# Patient Record
Sex: Female | Born: 1975 | Race: White | Hispanic: No | Smoking: Current every day smoker
Health system: Southern US, Community
[De-identification: ages and names within clinical notes are randomized; demographics above are authoritative.]

## PROBLEM LIST (undated history)

## (undated) DIAGNOSIS — Z8619 Personal history of other infectious and parasitic diseases: Secondary | ICD-10-CM

## (undated) DIAGNOSIS — K449 Diaphragmatic hernia without obstruction or gangrene: Secondary | ICD-10-CM

## (undated) DIAGNOSIS — E039 Hypothyroidism, unspecified: Secondary | ICD-10-CM

## (undated) DIAGNOSIS — F4310 Post-traumatic stress disorder, unspecified: Secondary | ICD-10-CM

## (undated) DIAGNOSIS — Z87442 Personal history of urinary calculi: Secondary | ICD-10-CM

## (undated) DIAGNOSIS — J309 Allergic rhinitis, unspecified: Secondary | ICD-10-CM

## (undated) DIAGNOSIS — I10 Essential (primary) hypertension: Secondary | ICD-10-CM

## (undated) DIAGNOSIS — F419 Anxiety disorder, unspecified: Secondary | ICD-10-CM

## (undated) DIAGNOSIS — K219 Gastro-esophageal reflux disease without esophagitis: Secondary | ICD-10-CM

## (undated) DIAGNOSIS — N200 Calculus of kidney: Secondary | ICD-10-CM

## (undated) DIAGNOSIS — J342 Deviated nasal septum: Secondary | ICD-10-CM

## (undated) DIAGNOSIS — F329 Major depressive disorder, single episode, unspecified: Secondary | ICD-10-CM

## (undated) DIAGNOSIS — G5710 Meralgia paresthetica, unspecified lower limb: Secondary | ICD-10-CM

## (undated) DIAGNOSIS — E785 Hyperlipidemia, unspecified: Secondary | ICD-10-CM

## (undated) DIAGNOSIS — Z8489 Family history of other specified conditions: Secondary | ICD-10-CM

## (undated) HISTORY — DX: Post-traumatic stress disorder, unspecified: F43.10

## (undated) HISTORY — DX: Family history of other specified conditions: Z84.89

## (undated) HISTORY — DX: Anxiety disorder, unspecified: F41.9

## (undated) HISTORY — PX: BLADDER SURGERY: SHX569

## (undated) HISTORY — DX: Major depressive disorder, single episode, unspecified: F32.9

## (undated) HISTORY — PX: WISDOM TOOTH EXTRACTION: SHX21

## (undated) HISTORY — DX: Calculus of kidney: N20.0

## (undated) HISTORY — DX: Personal history of other infectious and parasitic diseases: Z86.19

## (undated) HISTORY — DX: Meralgia paresthetica, unspecified lower limb: G57.10

## (undated) HISTORY — DX: Personal history of urinary calculi: Z87.442

## (undated) HISTORY — DX: Allergic rhinitis, unspecified: J30.9

## (undated) HISTORY — DX: Hyperlipidemia, unspecified: E78.5

## (undated) HISTORY — DX: Diaphragmatic hernia without obstruction or gangrene: K44.9

## (undated) HISTORY — DX: Deviated nasal septum: J34.2

## (undated) HISTORY — DX: Essential (primary) hypertension: I10

---

## 2004-07-17 ENCOUNTER — Ambulatory Visit: Payer: Self-pay | Admitting: Family Medicine

## 2004-09-18 ENCOUNTER — Ambulatory Visit (HOSPITAL_COMMUNITY): Admission: RE | Admit: 2004-09-18 | Discharge: 2004-09-18 | Payer: Self-pay | Admitting: *Deleted

## 2004-09-18 ENCOUNTER — Ambulatory Visit: Payer: Self-pay | Admitting: *Deleted

## 2004-09-24 ENCOUNTER — Ambulatory Visit (HOSPITAL_COMMUNITY): Admission: RE | Admit: 2004-09-24 | Discharge: 2004-09-24 | Payer: Self-pay | Admitting: *Deleted

## 2004-09-24 ENCOUNTER — Ambulatory Visit: Payer: Self-pay | Admitting: Cardiology

## 2004-09-27 ENCOUNTER — Ambulatory Visit: Payer: Self-pay | Admitting: *Deleted

## 2008-11-28 ENCOUNTER — Emergency Department (HOSPITAL_COMMUNITY): Admission: EM | Admit: 2008-11-28 | Discharge: 2008-11-28 | Payer: Self-pay | Admitting: Emergency Medicine

## 2008-11-29 ENCOUNTER — Ambulatory Visit (HOSPITAL_COMMUNITY): Admission: RE | Admit: 2008-11-29 | Discharge: 2008-11-29 | Payer: Self-pay | Admitting: Emergency Medicine

## 2008-11-30 ENCOUNTER — Emergency Department (HOSPITAL_COMMUNITY): Admission: EM | Admit: 2008-11-30 | Discharge: 2008-12-01 | Payer: Self-pay | Admitting: Emergency Medicine

## 2008-12-13 ENCOUNTER — Encounter (INDEPENDENT_AMBULATORY_CARE_PROVIDER_SITE_OTHER): Payer: Self-pay | Admitting: *Deleted

## 2008-12-26 ENCOUNTER — Emergency Department (HOSPITAL_COMMUNITY): Admission: EM | Admit: 2008-12-26 | Discharge: 2008-12-26 | Payer: Self-pay | Admitting: Emergency Medicine

## 2010-09-16 LAB — PREGNANCY, URINE: Preg Test, Ur: NEGATIVE

## 2010-09-17 LAB — COMPREHENSIVE METABOLIC PANEL
Albumin: 4.2 g/dL (ref 3.5–5.2)
Alkaline Phosphatase: 61 U/L (ref 39–117)
BUN: 12 mg/dL (ref 6–23)
CO2: 28 mEq/L (ref 19–32)
Calcium: 9.6 mg/dL (ref 8.4–10.5)
Chloride: 106 mEq/L (ref 96–112)
Creatinine, Ser: 0.81 mg/dL (ref 0.4–1.2)
Glucose, Bld: 105 mg/dL — ABNORMAL HIGH (ref 70–99)
Sodium: 137 mEq/L (ref 135–145)
Total Bilirubin: 0.5 mg/dL (ref 0.3–1.2)
Total Protein: 7.4 g/dL (ref 6.0–8.3)

## 2010-09-17 LAB — BASIC METABOLIC PANEL
CO2: 24 mEq/L (ref 19–32)
Calcium: 9 mg/dL (ref 8.4–10.5)
Chloride: 109 mEq/L (ref 96–112)
Creatinine, Ser: 0.88 mg/dL (ref 0.4–1.2)
GFR calc non Af Amer: >60 mL/min (ref >60)
Potassium: 3.3 mEq/L — ABNORMAL LOW (ref 3.5–5.1)

## 2010-09-17 LAB — DIFFERENTIAL
Basophils Absolute: 0 10*3/uL (ref 0.0–0.1)
Eosinophils Absolute: 0.1 10*3/uL (ref 0.0–0.7)
Eosinophils Relative: 1 % (ref 0–5)
Lymphocytes Relative: 19 % (ref 12–46)
Lymphs Abs: 1.9 10*3/uL (ref 0.7–4.0)
Lymphs Abs: 2.1 10*3/uL (ref 0.7–4.0)
Monocytes Absolute: 0.7 10*3/uL (ref 0.1–1.0)
Monocytes Relative: 10 % (ref 3–12)
Monocytes Relative: 7 % (ref 3–12)
Neutro Abs: 7 10*3/uL (ref 1.7–7.7)
Neutrophils Relative %: 62 % (ref 43–77)

## 2010-09-17 LAB — CBC
HCT: 32.5 % — ABNORMAL LOW (ref 36.0–46.0)
HCT: 36.5 % (ref 36.0–46.0)
Hemoglobin: 11.4 g/dL — ABNORMAL LOW (ref 12.0–15.0)
Hemoglobin: 12.8 g/dL (ref 12.0–15.0)
MCV: 89.8 fL (ref 78.0–100.0)
Platelets: 230 10*3/uL (ref 150–400)
RBC: 3.69 MIL/uL — ABNORMAL LOW (ref 3.87–5.11)
RBC: 4.07 MIL/uL (ref 3.87–5.11)
RDW: 12.7 % (ref 11.5–15.5)

## 2010-09-17 LAB — URINALYSIS, ROUTINE W REFLEX MICROSCOPIC
Bilirubin Urine: NEGATIVE
Hgb urine dipstick: NEGATIVE
Ketones, ur: NEGATIVE mg/dL
Nitrite: NEGATIVE
Protein, ur: NEGATIVE mg/dL
Specific Gravity, Urine: >1.03 — ABNORMAL HIGH (ref 1.005–1.030)

## 2010-09-17 LAB — PREGNANCY, URINE: Preg Test, Ur: NEGATIVE

## 2010-10-26 NOTE — Procedures (Signed)
[name], Catherine [name]                ACCOUNT NO.:  [id number]   MEDICAL RECORD NO.:  [id number]          PATIENT TYPE:  OUT   LOCATION:  RAD                           FACILITY:  [facility]   PHYSICIAN:  [place] Bing, M.D.  DATE OF BIRTH:  03-24-1976   DATE OF PROCEDURE:  09/24/2004  DATE OF DISCHARGE:                                  ECHOCARDIOGRAM   REFERRING PHYSICIAN:  Vida Roller, M.D.   CLINICAL DATA:  A 35 year old woman with chest pain.   M-MODE:  Aorta 2.8, left atrium 4.4, septum 1, posterior wall 1, LV diastole  4.6, LV systole 3.2.   IMPRESSION:  1.  Technically adequate echocardiographic study.  2.  Left atrial size at the upper limit of normal;  normal right atrium and      right ventricle.  3.  Normal aortic, tricuspid, mitral, and pulmonic valves;  physiologic      tricuspid regurgitation.  4.  Normal internal dimension, wall thickness, regional and global function      of the left ventricle.  5.  Normal inferior vena cava.      RR/MEDQ  D:  09/24/2004  T:  09/24/2004  Job:  [id number]

## 2012-02-26 ENCOUNTER — Other Ambulatory Visit: Payer: Self-pay | Admitting: Obstetrics & Gynecology

## 2012-02-26 ENCOUNTER — Other Ambulatory Visit (HOSPITAL_COMMUNITY)
Admission: RE | Admit: 2012-02-26 | Discharge: 2012-02-26 | Disposition: A | Discharge status: Home / Self Care | Payer: BC Managed Care – PPO | Source: Ambulatory Visit | Attending: Obstetrics & Gynecology | Admitting: Obstetrics & Gynecology

## 2012-02-26 DIAGNOSIS — Z01419 Encounter for gynecological examination (general) (routine) without abnormal findings: Secondary | ICD-10-CM | POA: Insufficient documentation

## 2012-03-12 ENCOUNTER — Other Ambulatory Visit: Payer: Self-pay

## 2012-03-12 ENCOUNTER — Ambulatory Visit (HOSPITAL_COMMUNITY): Payer: Self-pay

## 2012-03-12 ENCOUNTER — Other Ambulatory Visit (HOSPITAL_COMMUNITY): Payer: Self-pay

## 2012-03-12 ENCOUNTER — Other Ambulatory Visit (HOSPITAL_COMMUNITY): Payer: Self-pay | Admitting: Nurse Practitioner

## 2012-03-12 ENCOUNTER — Other Ambulatory Visit (HOSPITAL_COMMUNITY): Payer: Self-pay | Admitting: *Deleted

## 2012-03-12 ENCOUNTER — Ambulatory Visit (HOSPITAL_COMMUNITY)
Admission: RE | Admit: 2012-03-12 | Discharge: 2012-03-12 | Disposition: A | Discharge status: Home / Self Care | Payer: BC Managed Care – PPO | Source: Ambulatory Visit | Attending: Nurse Practitioner | Admitting: Nurse Practitioner

## 2012-03-12 DIAGNOSIS — R52 Pain, unspecified: Secondary | ICD-10-CM

## 2012-03-12 DIAGNOSIS — N949 Unspecified condition associated with female genital organs and menstrual cycle: Secondary | ICD-10-CM | POA: Insufficient documentation

## 2012-03-12 DIAGNOSIS — N920 Excessive and frequent menstruation with regular cycle: Secondary | ICD-10-CM | POA: Insufficient documentation

## 2012-04-03 ENCOUNTER — Telehealth: Payer: Self-pay

## 2012-05-14 NOTE — Telephone Encounter (Signed)
No message

## 2013-02-19 ENCOUNTER — Emergency Department (HOSPITAL_COMMUNITY): Payer: BC Managed Care – PPO

## 2013-02-19 ENCOUNTER — Emergency Department (HOSPITAL_COMMUNITY)
Admission: EM | Admit: 2013-02-19 | Discharge: 2013-02-19 | Disposition: A | Discharge status: Home / Self Care | Payer: Self-pay | Attending: Emergency Medicine | Admitting: Emergency Medicine

## 2013-02-19 ENCOUNTER — Encounter (HOSPITAL_COMMUNITY): Payer: Self-pay | Admitting: Emergency Medicine

## 2013-02-19 DIAGNOSIS — Z79899 Other long term (current) drug therapy: Secondary | ICD-10-CM | POA: Insufficient documentation

## 2013-02-19 DIAGNOSIS — M545 Low back pain, unspecified: Secondary | ICD-10-CM | POA: Insufficient documentation

## 2013-02-19 DIAGNOSIS — M5441 Lumbago with sciatica, right side: Secondary | ICD-10-CM

## 2013-02-19 DIAGNOSIS — E039 Hypothyroidism, unspecified: Secondary | ICD-10-CM | POA: Insufficient documentation

## 2013-02-19 DIAGNOSIS — M5430 Sciatica, unspecified side: Secondary | ICD-10-CM | POA: Insufficient documentation

## 2013-02-19 HISTORY — DX: Hypothyroidism, unspecified: E03.9

## 2013-02-19 MED ORDER — DIAZEPAM 5 MG/ML IJ SOLN
5.0000 mg | Freq: Once | INTRAMUSCULAR | Status: AC
  Administered 2013-02-19: 5 mg via INTRAMUSCULAR
  Filled 2013-02-19: qty 2

## 2013-02-19 MED ORDER — METHOCARBAMOL 500 MG PO TABS: 500.0000 mg | ORAL_TABLET | Freq: Two times a day (BID) | ORAL | Status: DC

## 2013-02-19 MED ORDER — HYDROCODONE-ACETAMINOPHEN 5-325 MG PO TABS: 1.0000 | ORAL_TABLET | Freq: Four times a day (QID) | ORAL | Status: DC | PRN

## 2013-02-19 MED ORDER — IBUPROFEN 800 MG PO TABS: 800.0000 mg | ORAL_TABLET | Freq: Three times a day (TID) | ORAL | Status: DC

## 2013-02-19 MED ORDER — OXYCODONE-ACETAMINOPHEN 5-325 MG PO TABS
2.0000 | ORAL_TABLET | Freq: Once | ORAL | Status: AC
  Administered 2013-02-19: 2 via ORAL
  Filled 2013-02-19: qty 2

## 2013-02-19 NOTE — ED Notes (Signed)
Patient states that she has had to stand since she came here, because "it hurts too bad to sit".

## 2013-02-19 NOTE — ED Provider Notes (Signed)
CSN: [id number]     Arrival date & time 02/19/13  1342 History  This chart was scribed for non-physician practitioner, Felicie Morn, NP, working with Dagmar Hait, MD by Shari Heritage, ED Scribe. This patient was seen in room TR07C/TR07C and the patient's care was started at 3:16 PM.    Chief Complaint  Patient presents with  . Back Pain    Patient is a 37 y.o. female presenting with back pain. The history is provided by the patient. No language interpreter was used.  Back Pain Location:  Lumbar spine Radiates to: right hip. Pain severity:  Moderate Duration: onset this morning. Timing:  Constant Progression:  Unchanged Chronicity:  New Context comment:  Strain Ineffective treatments: Toradol. Associated symptoms: no bladder incontinence, no bowel incontinence and no weakness     HPI Comments: Catherine [name] is a 37 y.o. female who presents to the Emergency Department complaining of constant, moderate lower back pain that radiates to her right hip onset this morning. She states that she strained her back while trying to get out of bed. She was seen for this problem in a clinic and was given a shot of Toradol, but it has not improved pain. She denies extremity weakness, bowel incontinence or bladder incontinence. She denies a prior history of back pain. She has a medical history of hypothyroidism. She takes levothyroxine daily. She does not smoke.  Past Medical History  Diagnosis Date  . Hypothyroidism    Past Surgical History  Procedure Laterality Date  . Bladder surgery      reports had bladder stretched as child  . Wisdom tooth extraction     No family history on file. History  Substance Use Topics  . Smoking status: Never Smoker   . Smokeless tobacco: Not on file  . Alcohol Use: No   OB History   Grav Para Term Preterm Abortions TAB SAB Ect Mult Living                 Review of Systems  Gastrointestinal: Negative for bowel incontinence.  Genitourinary:  Negative for bladder incontinence.  Musculoskeletal: Positive for back pain.  Neurological: Negative for weakness.  All other systems reviewed and are negative.    Allergies  Morphine and related and Sulfa antibiotics  Home Medications   Current Outpatient Rx  Name  Route  Sig  Dispense  Refill  . docusate sodium (COLACE) 100 MG capsule   Oral   Take 100 mg by mouth as needed for constipation.         . Fiber CHEW   Oral   Chew 1 tablet by mouth daily.         . fluticasone (FLONASE) 50 MCG/ACT nasal spray   Nasal   Place 1 spray into the nose as needed for rhinitis or allergies.         [name] ibuprofen (ADVIL,MOTRIN) 200 MG tablet   Oral   Take 800 mg by mouth every 6 (six) hours as needed for pain.         [name] levothyroxine (SYNTHROID, LEVOTHROID) 100 MCG tablet   Oral   Take 100 mcg by mouth daily before breakfast.         . omeprazole (PRILOSEC) 20 MG capsule   Oral   Take 20 mg by mouth as needed (acid reflux).          Triage Vitals: BP 168/93  Pulse 89  Temp(Src) 98.3 F (36.8 C) (Oral)  Resp 20  SpO2 96%  LMP 02/07/2013 Physical Exam  Nursing note and vitals reviewed. Constitutional: She is oriented to person, place, and time. She appears well-developed and well-nourished. No distress.  HENT:  Head: Normocephalic and atraumatic.  Eyes: EOM are normal.  Neck: Neck supple. No tracheal deviation present.  Cardiovascular: Normal rate and regular rhythm.   Pulmonary/Chest: Effort normal and breath sounds normal. No respiratory distress.  Musculoskeletal: Normal range of motion.  Low back pain radiating to right hip, spasmodic and sharp in nature.  Neurological: She is alert and oriented to person, place, and time.  Skin: Skin is warm and dry.  Psychiatric: She has a normal mood and affect. Her behavior is normal.    ED Course  Procedures (including critical care time) DIAGNOSTIC STUDIES: Oxygen Saturation is 96% on room air, adequate by my  interpretation.    COORDINATION OF CARE: 3:24 PM- I suspect muscle spasm of the lumbar muscles, but will order x-ray to rule out bony fracture. Will order a muscle relaxant. Patient informed of current plan for treatment and evaluation and agrees with plan at this time.    Imaging Review Dg Lumbar Spine Complete  02/19/2013   CLINICAL DATA:  Back injury, lower back pain extending right sacral region  EXAM: LUMBAR SPINE - COMPLETE 4+ VIEW  COMPARISON:  None.  FINDINGS: There is no evidence of lumbar spine fracture. Alignment is normal. Intervertebral disc spaces are maintained. Minimal anterior spurring upper endplate of L4 vertebral body.  IMPRESSION: No acute fracture or subluxation. Minimal anterior spurring upper endplate of L4 vertebral body.   Electronically Signed   By: Natasha Mead   On: 02/19/2013 16:06   Radiology results reviewed and discussed with patient.  Patient feels better after medication. Patient ambulatory in the department without difficulty.   MDM   Low back pain.   I personally performed the services described in this documentation, which was scribed in my presence. The recorded information has been reviewed and is accurate.    Jimmye Norman, NP 02/19/13 1640

## 2013-02-19 NOTE — ED Notes (Signed)
Pt reports lower back pain that's radiation to R hip ; pt reports hurt back when got out of bed this AM, no hx of back pain prior; pt had toradol shot at clinic and reports it did not help; denies loss in bowel function, no radiation to legs

## 2013-02-20 NOTE — ED Provider Notes (Signed)
Medical screening examination/treatment/procedure(s) were performed by non-physician practitioner and as supervising physician I was immediately available for consultation/collaboration.   Dagmar Hait, MD 02/20/13 [phone]

## 2013-04-15 ENCOUNTER — Other Ambulatory Visit: Payer: Self-pay

## 2013-06-08 ENCOUNTER — Encounter: Payer: Self-pay | Admitting: Advanced Practice Midwife

## 2013-06-08 ENCOUNTER — Ambulatory Visit (INDEPENDENT_AMBULATORY_CARE_PROVIDER_SITE_OTHER): Payer: Managed Care, Other (non HMO) | Admitting: Advanced Practice Midwife

## 2013-06-08 ENCOUNTER — Other Ambulatory Visit: Payer: Self-pay | Admitting: Advanced Practice Midwife

## 2013-06-08 VITALS — BP 140/90 | Ht 69.0 in | Wt 243.0 lb

## 2013-06-08 DIAGNOSIS — R928 Other abnormal and inconclusive findings on diagnostic imaging of breast: Secondary | ICD-10-CM

## 2013-06-08 DIAGNOSIS — N63 Unspecified lump in unspecified breast: Secondary | ICD-10-CM

## 2013-06-08 NOTE — Progress Notes (Signed)
Catherine [name] 37 y.o. Filed Vitals:   06/08/13 1514  BP: 140/90   Past Medical History  Diagnosis Date  . Hypothyroidism    Past Surgical History  Procedure Laterality Date  . Bladder surgery      reports had bladder stretched as child  . Wisdom tooth extraction     Catherine [name] does not currently have medications on file.  About 3 weeks ago noticed a nontender lump in left breast during self exam.  Physical Exam:  1.5 cm firm, nontender, mobile nodule at 11 o'clock in L breast.  No retractions or discharge.  Right breast unremarkable.  Mammogram 06/15/13 at [facility] in [place].

## 2013-06-14 ENCOUNTER — Other Ambulatory Visit: Payer: Self-pay | Admitting: Obstetrics and Gynecology

## 2013-06-14 ENCOUNTER — Other Ambulatory Visit: Payer: Self-pay

## 2013-06-14 DIAGNOSIS — N63 Unspecified lump in unspecified breast: Secondary | ICD-10-CM

## 2013-06-15 ENCOUNTER — Ambulatory Visit
Admission: RE | Admit: 2013-06-15 | Discharge: 2013-06-15 | Disposition: A | Discharge status: Home / Self Care | Payer: Private Health Insurance - Indemnity | Source: Ambulatory Visit | Attending: Advanced Practice Midwife | Admitting: Advanced Practice Midwife

## 2013-06-15 DIAGNOSIS — N63 Unspecified lump in unspecified breast: Secondary | ICD-10-CM

## 2013-07-27 ENCOUNTER — Other Ambulatory Visit: Payer: Managed Care, Other (non HMO) | Admitting: Obstetrics & Gynecology

## 2013-08-04 ENCOUNTER — Other Ambulatory Visit: Payer: Self-pay | Admitting: Internal Medicine

## 2013-08-05 ENCOUNTER — Other Ambulatory Visit: Payer: Managed Care, Other (non HMO) | Admitting: Obstetrics & Gynecology

## 2013-08-13 ENCOUNTER — Other Ambulatory Visit: Payer: Managed Care, Other (non HMO) | Admitting: Obstetrics & Gynecology

## 2013-08-23 ENCOUNTER — Ambulatory Visit (INDEPENDENT_AMBULATORY_CARE_PROVIDER_SITE_OTHER): Payer: Managed Care, Other (non HMO) | Admitting: Obstetrics & Gynecology

## 2013-08-23 ENCOUNTER — Other Ambulatory Visit (HOSPITAL_COMMUNITY)
Admission: RE | Admit: 2013-08-23 | Discharge: 2013-08-23 | Disposition: A | Discharge status: Home / Self Care | Payer: Private Health Insurance - Indemnity | Source: Ambulatory Visit | Attending: Obstetrics & Gynecology | Admitting: Obstetrics & Gynecology

## 2013-08-23 ENCOUNTER — Encounter: Payer: Self-pay | Admitting: Obstetrics & Gynecology

## 2013-08-23 VITALS — BP 120/80 | Ht 69.0 in | Wt 239.0 lb

## 2013-08-23 DIAGNOSIS — Z01419 Encounter for gynecological examination (general) (routine) without abnormal findings: Secondary | ICD-10-CM

## 2013-08-23 DIAGNOSIS — Z1151 Encounter for screening for human papillomavirus (HPV): Secondary | ICD-10-CM | POA: Insufficient documentation

## 2013-08-23 MED ORDER — MEGESTROL ACETATE 40 MG PO TABS: 40.0000 mg | ORAL_TABLET | Freq: Every day | ORAL | Status: DC

## 2013-08-23 NOTE — Progress Notes (Signed)
Patient ID: Catherine [name], female   DOB: 1975-10-16, 38 y.o.   MRN: [id number] Subjective:     Catherine [name] is a 38 y.o. female here for a routine exam.  Patient's last menstrual period was 08/03/2013. No obstetric history on file. Birth Control Method:  No BCM Menstrual Calendar(currently): twice a month  Current complaints: heavy painful periods, pain with intercourse 75%, interrupts every time.   Current acute medical issues:  hypothyroid   Recent Gynecologic History Patient's last menstrual period was 08/03/2013. Last Pap: 2013,  normal Last mammogram: 2015,  normal  Past Medical History  Diagnosis Date  . Hypothyroidism     Past Surgical History  Procedure Laterality Date  . Bladder surgery      reports had bladder stretched as child  . Wisdom tooth extraction      OB History   Grav Para Term Preterm Abortions TAB SAB Ect Mult Living                  History   Social History  . Marital Status: Married    Spouse Name: N/A    Number of Children: N/A  . Years of Education: N/A   Social History Main Topics  . Smoking status: Former Smoker -- 0.50 packs/day for 19 years    Types: Cigarettes    Quit date: 10/08/2011  . Smokeless tobacco: Never Used  . Alcohol Use: 0.0 oz/week     Comment: very rarley  . Drug Use: No  . Sexual Activity: Yes   Other Topics Concern  . None   Social History Narrative  . None    Family History  Problem Relation Age of Onset  . Heart disease Father   . Hypertension Father   . Cancer Maternal Aunt   . Thyroid disease Maternal Aunt   . Cancer Paternal Aunt   . Thyroid disease Maternal Grandmother   . Cancer Maternal Grandmother   . Diabetes Maternal Grandfather   . Hypertension Maternal Grandfather      Review of Systems  Review of Systems  Constitutional: Negative for fever, chills, weight loss, malaise/fatigue and diaphoresis.  HENT: Negative for hearing loss, ear pain, nosebleeds, congestion, sore throat, neck  pain, tinnitus and ear discharge.   Eyes: Negative for blurred vision, double vision, photophobia, pain, discharge and redness.  Respiratory: Negative for cough, hemoptysis, sputum production, shortness of breath, wheezing and stridor.   Cardiovascular: Negative for chest pain, palpitations, orthopnea, claudication, leg swelling and PND.  Gastrointestinal: negative for abdominal pain. Negative for heartburn, nausea, vomiting, diarrhea, constipation, blood in stool and melena.  Genitourinary: Negative for dysuria, urgency, frequency, hematuria and flank pain.  Musculoskeletal: Negative for myalgias, back pain, joint pain and falls.  Skin: Negative for itching and rash.  Neurological: Negative for dizziness, tingling, tremors, sensory change, speech change, focal weakness, seizures, loss of consciousness, weakness and headaches.  Endo/Heme/Allergies: Negative for environmental allergies and polydipsia. Does not bruise/bleed easily.  Psychiatric/Behavioral: Negative for depression, suicidal ideas, hallucinations, memory loss and substance abuse. The patient is not nervous/anxious and does not have insomnia.        Objective:    Physical Exam  Vitals reviewed. Constitutional: She is oriented to person, place, and time. She appears well-developed and well-nourished.  HENT:  Head: Normocephalic and atraumatic.        Right Ear: External ear normal.  Left Ear: External ear normal.  Nose: Nose normal.  Mouth/Throat: Oropharynx is clear and moist.  Eyes: Conjunctivae and  EOM are normal. Pupils are equal, round, and reactive to light. Right eye exhibits no discharge. Left eye exhibits no discharge. No scleral icterus.  Neck: Normal range of motion. Neck supple. No tracheal deviation present. No thyromegaly present.  Cardiovascular: Normal rate, regular rhythm, normal heart sounds and intact distal pulses.  Exam reveals no gallop and no friction rub.   No murmur heard. Respiratory: Effort normal  and breath sounds normal. No respiratory distress. She has no wheezes. She has no rales. She exhibits no tenderness.  GI: Soft. Bowel sounds are normal. She exhibits no distension and no mass. There is no tenderness. There is no rebound and no guarding.  Genitourinary:  Breasts no masses skin changes or nipple changes bilaterally      Vulva is normal without lesions Vagina is pink moist without discharge Cervix normal in appearance and pap is done, very tender with slight Cervical motion Uterus is normal size shape and contour Adnexa is negative with normal sized ovaries   Musculoskeletal: Normal range of motion. She exhibits no edema and no tenderness.  Neurological: She is alert and oriented to person, place, and time. She has normal reflexes. She displays normal reflexes. No cranial nerve deficit. She exhibits normal muscle tone. Coordination normal.  Skin: Skin is warm and dry. No rash noted. No erythema. No pallor.  Psychiatric: She has a normal mood and affect. Her behavior is normal. Judgment and thought content normal.       Assessment:    Healthy female exam.   Dysmenorrhea Menorrhagia dyspreunia Plan:    Follow up in: 1 month.   Megestrol for 1 month Sonogram 1 month

## 2013-09-21 ENCOUNTER — Other Ambulatory Visit: Payer: Self-pay | Admitting: Obstetrics & Gynecology

## 2013-09-21 ENCOUNTER — Encounter: Payer: Self-pay | Admitting: Obstetrics & Gynecology

## 2013-09-21 ENCOUNTER — Ambulatory Visit (INDEPENDENT_AMBULATORY_CARE_PROVIDER_SITE_OTHER): Payer: Managed Care, Other (non HMO)

## 2013-09-21 ENCOUNTER — Ambulatory Visit (INDEPENDENT_AMBULATORY_CARE_PROVIDER_SITE_OTHER): Payer: Managed Care, Other (non HMO) | Admitting: Obstetrics & Gynecology

## 2013-09-21 VITALS — BP 130/90 | Wt 226.0 lb

## 2013-09-21 DIAGNOSIS — IMO0002 Reserved for concepts with insufficient information to code with codable children: Secondary | ICD-10-CM

## 2013-09-21 DIAGNOSIS — N946 Dysmenorrhea, unspecified: Secondary | ICD-10-CM

## 2013-09-21 DIAGNOSIS — N920 Excessive and frequent menstruation with regular cycle: Secondary | ICD-10-CM

## 2013-09-21 DIAGNOSIS — Z01419 Encounter for gynecological examination (general) (routine) without abnormal findings: Secondary | ICD-10-CM

## 2013-09-21 MED ORDER — HYDROCODONE-ACETAMINOPHEN 5-325 MG PO TABS: 1.0000 | ORAL_TABLET | Freq: Four times a day (QID) | ORAL | Status: DC | PRN

## 2013-09-21 NOTE — Progress Notes (Signed)
Patient ID: KOREE STAHELI, female   DOB: [birth year], 38 y.o.   MRN: [id number] US Transvaginal Non-ob  09/21/2013   GYNECOLOGIC SONOGRAM   MARIYANA FULOP is a 38 y.o. G1P0010 LMP 09/18/2013(spotting) for a pelvic  sonogram for dysmenorrhea, dyspareunia and menorrhagia. Pt currently  taking Megace.  Uterus                      7.7 x 4.6 x 4.7 cm, anteverted uterus no  myometrial masses noted   Endometrium          12.0 mm, asymmetrical, hyperechoic area noted within  cavity=7+mm  Right ovary             2.6 x 2.2 x 1.5 cm,   Left ovary                3.2 x 2.7 x 1.7 cm,   No free fluid or adnexal masses noted within pelvis  Technician Comments:  Anteverted uterus noted, ?endometrial polyp noted within cavity(7+mm),  bilateral adnexa/ovaries appear WNL, no free fluid or adnexal masses noted     Alicia Amel 09/21/2013 11:44 AM  Clinical Impression and recommendations:  I have reviewed the sonogram results above, combined with the patient's  current clinical course, below are my impressions and any appropriate  recommendations for management based on the sonographic findings.  Normal pelvic anatomy, no anatomical etiology for patient's symptoms Small endometrial polyp not clinically significant  Florian Buff 09/21/2013 11:58 AM      See previous note  75% dyspareunia Severe interrupts  Discussed options only viable 1 is hysterectomy with removal of cervix Would have to be abdominal  Past Medical History  Diagnosis Date  . Hypothyroidism     Past Surgical History  Procedure Laterality Date  . Bladder surgery      reports had bladder stretched as child  . Wisdom tooth extraction      OB History   Grav Para Term Preterm Abortions TAB SAB Ect Mult Living   1 0 0 0 1 0 1 0 0 0       Allergies  Allergen Reactions  . Morphine And Related Other (See Comments)    headache  . Sulfa Antibiotics Other (See Comments)    Gives pt Yeast infection    History   Social History  . Marital Status:  Married    Spouse Name: N/A    Number of Children: N/A  . Years of Education: N/A   Social History Main Topics  . Smoking status: Former Smoker -- 0.50 packs/day for 19 years    Types: Cigarettes    Quit date: 10/08/2011  . Smokeless tobacco: Never Used  . Alcohol Use: 0.0 oz/week     Comment: very rarley  . Drug Use: No  . Sexual Activity: Yes   Other Topics Concern  . None   Social History Narrative  . None    Family History  Problem Relation Age of Onset  . Heart disease Father   . Hypertension Father   . Cancer Maternal Aunt   . Thyroid disease Maternal Aunt   . Cancer Paternal Aunt   . Thyroid disease Maternal Grandmother   . Cancer Maternal Grandmother   . Diabetes Maternal Grandfather   . Hypertension Maternal Grandfather

## 2013-09-22 ENCOUNTER — Telehealth: Payer: Self-pay | Admitting: Obstetrics & Gynecology

## 2013-09-22 NOTE — Telephone Encounter (Signed)
Pt states that she wants to go ahead and get the surgery scheduled.

## 2013-09-23 ENCOUNTER — Ambulatory Visit: Payer: Managed Care, Other (non HMO) | Admitting: Obstetrics & Gynecology

## 2013-09-23 ENCOUNTER — Other Ambulatory Visit: Payer: Managed Care, Other (non HMO)

## 2013-09-24 ENCOUNTER — Encounter (HOSPITAL_COMMUNITY): Payer: Self-pay | Admitting: Pharmacy Technician

## 2013-09-30 ENCOUNTER — Encounter: Payer: Managed Care, Other (non HMO) | Admitting: Obstetrics & Gynecology

## 2013-10-01 ENCOUNTER — Other Ambulatory Visit: Payer: Self-pay | Admitting: Obstetrics & Gynecology

## 2013-10-01 ENCOUNTER — Encounter (HOSPITAL_COMMUNITY)
Admission: RE | Admit: 2013-10-01 | Discharge: 2013-10-01 | Disposition: A | Discharge status: Home / Self Care | Payer: Private Health Insurance - Indemnity | Source: Ambulatory Visit | Attending: Obstetrics & Gynecology | Admitting: Obstetrics & Gynecology

## 2013-10-01 ENCOUNTER — Encounter (HOSPITAL_COMMUNITY): Payer: Self-pay

## 2013-10-01 DIAGNOSIS — Z01812 Encounter for preprocedural laboratory examination: Secondary | ICD-10-CM | POA: Insufficient documentation

## 2013-10-01 HISTORY — DX: Gastro-esophageal reflux disease without esophagitis: K21.9

## 2013-10-01 LAB — URINALYSIS, ROUTINE W REFLEX MICROSCOPIC
Bilirubin Urine: NEGATIVE
Glucose, UA: NEGATIVE mg/dL
Ketones, ur: NEGATIVE mg/dL
Leukocytes, UA: NEGATIVE
Nitrite: POSITIVE — AB
Protein, ur: NEGATIVE mg/dL
Specific Gravity, Urine: >1.03 — ABNORMAL HIGH (ref 1.005–1.030)
Urobilinogen, UA: 0.2 mg/dL (ref 0.0–1.0)
pH: 6 (ref 5.0–8.0)

## 2013-10-01 LAB — CBC
HCT: 38 % (ref 36.0–46.0)
HEMOGLOBIN: 12.7 g/dL (ref 12.0–15.0)
MCH: 29.7 pg (ref 26.0–34.0)
MCHC: 33.4 g/dL (ref 30.0–36.0)
MCV: 89 fL (ref 78.0–100.0)
PLATELETS: 251 10*3/uL (ref 150–400)
RBC: 4.27 MIL/uL (ref 3.87–5.11)
RDW: 13.3 % (ref 11.5–15.5)
WBC: 5.3 10*3/uL (ref 4.0–10.5)

## 2013-10-01 LAB — COMPREHENSIVE METABOLIC PANEL WITH GFR
ALT: 11 U/L (ref 0–35)
AST: 12 U/L (ref 0–37)
Albumin: 3.9 g/dL (ref 3.5–5.2)
Alkaline Phosphatase: 53 U/L (ref 39–117)
BUN: 12 mg/dL (ref 6–23)
CO2: 26 meq/L (ref 19–32)
Calcium: 9.9 mg/dL (ref 8.4–10.5)
Chloride: 106 meq/L (ref 96–112)
Creatinine, Ser: 1.02 mg/dL (ref 0.50–1.10)
GFR calc Af Amer: 81 mL/min — ABNORMAL LOW
GFR calc non Af Amer: 70 mL/min — ABNORMAL LOW
Glucose, Bld: 92 mg/dL (ref 70–99)
Potassium: 4.6 meq/L (ref 3.7–5.3)
Sodium: 142 meq/L (ref 137–147)
Total Bilirubin: 0.3 mg/dL (ref 0.3–1.2)
Total Protein: 7.5 g/dL (ref 6.0–8.3)

## 2013-10-01 LAB — TYPE AND SCREEN
ABO/RH(D): A POS
Antibody Screen: NEGATIVE

## 2013-10-01 LAB — URINE MICROSCOPIC-ADD ON

## 2013-10-01 LAB — HCG, QUANTITATIVE, PREGNANCY: hCG, Beta Chain, Quant, S: <1 m[IU]/mL (ref <5)

## 2013-10-01 NOTE — Patient Instructions (Signed)
Catherine [name]  10/01/2013   Your procedure is scheduled on:  10/06/13  Report to [facility] at [place] [facility].  Call this number if you have problems the morning of surgery: [phone]   Remember:   Do not eat food or drink liquids after midnight.   Take these medicines the morning of surgery with A SIP OF WATER: pain pill, synthroid   Do not wear jewelry, make-up or nail polish.  Do not wear lotions, powders, or perfumes. You may wear deodorant.  Do not shave 48 hours prior to surgery. Men may shave face and neck.  Do not bring valuables to the hospital.  [facility] is not responsible                  for any belongings or valuables.               Contacts, dentures or bridgework may not be worn into surgery.  Leave suitcase in the car. After surgery it may be brought to your room.  For patients admitted to the hospital, discharge time is determined by your                treatment team.               Patients discharged the day of surgery will not be allowed to drive  home.  Name and phone number of your driver: family  Special Instructions: Shower using CHG 2 nights before surgery and the night before surgery.  If you shower the day of surgery use CHG.  Use special wash - you have one bottle of CHG for all showers.  You should use approximately 1/3 of the bottle for each shower.   Please read over the following fact sheets that you were given: Pain Booklet, Surgical Site Infection Prevention, Anesthesia Post-op Instructions and Care and Recovery After Surgery   PATIENT INSTRUCTIONS POST-ANESTHESIA  IMMEDIATELY FOLLOWING SURGERY:  Do not drive or operate machinery for the first twenty four hours after surgery.  Do not make any important decisions for twenty four hours after surgery or while taking narcotic pain medications or sedatives.  If you develop intractable nausea and vomiting or a severe headache please notify your doctor immediately.  FOLLOW-UP:  Please make an appointment with  your surgeon as instructed. You do not need to follow up with anesthesia unless specifically instructed to do so.  WOUND CARE INSTRUCTIONS (if applicable):  Keep a dry clean dressing on the anesthesia/puncture wound site if there is drainage.  Once the wound has quit draining you may leave it open to air.  Generally you should leave the bandage intact for twenty four hours unless there is drainage.  If the epidural site drains for more than 36-48 hours please call the anesthesia department.  QUESTIONS?:  Please feel free to call your physician or the hospital operator if you have any questions, and they will be happy to assist you.      Hysterectomy Information  A hysterectomy is a surgery in which your uterus is removed. This surgery may be done to treat various medical problems. After the surgery, you will no longer have menstrual periods. The surgery will also make you unable to become pregnant (sterile). The fallopian tubes and ovaries can be removed (bilateral salpingo-oophorectomy) during this surgery as well.  REASONS FOR A HYSTERECTOMY  Persistent, abnormal bleeding.  Lasting (chronic) pelvic pain or infection.  The lining of the uterus (endometrium) starts growing outside the  uterus (endometriosis).  The endometrium starts growing in the muscle of the uterus (adenomyosis).  The uterus falls down into the vagina (pelvic organ prolapse).  Noncancerous growths in the uterus (uterine fibroids) that cause symptoms.  Precancerous cells.  Cervical cancer or uterine cancer. TYPES OF HYSTERECTOMIES  Supracervical hysterectomy In this type, the top part of the uterus is removed, but not the cervix.  Total hysterectomy The uterus and cervix are removed.  Radical hysterectomy The uterus, the cervix, and the fibrous tissue that holds the uterus in place in the pelvis (parametrium) are removed. WAYS A HYSTERECTOMY CAN BE PERFORMED  Abdominal hysterectomy A large surgical cut (incision)  is made in the abdomen. The uterus is removed through this incision.  Vaginal hysterectomy An incision is made in the vagina. The uterus is removed through this incision. There are no abdominal incisions.  Conventional laparoscopic hysterectomy Three or four small incisions are made in the abdomen. A thin, lighted tube with a camera (laparoscope) is inserted into one of the incisions. Other tools are put through the other incisions. The uterus is cut into small pieces. The small pieces are removed through the incisions, or they are removed through the vagina.  Laparoscopically assisted vaginal hysterectomy (LAVH) Three or four small incisions are made in the abdomen. Part of the surgery is performed laparoscopically and part vaginally. The uterus is removed through the vagina.  Robot-assisted laparoscopic hysterectomy A laparoscope and other tools are inserted into 3 or 4 small incisions in the abdomen. A computer-controlled device is used to give the surgeon a 3D image and to help control the surgical instruments. This allows for more precise movements of surgical instruments. The uterus is cut into small pieces and removed through the incisions or removed through the vagina. RISKS AND COMPLICATIONS  Possible complications associated with this procedure include:  Bleeding and risk of blood transfusion. Tell your health care provider if you do not want to receive any blood products.  Blood clots in the legs or lung.  Infection.  Injury to surrounding organs.  Problems or side effects related to anesthesia.  Conversion to an abdominal hysterectomy from one of the other techniques. WHAT TO EXPECT AFTER A HYSTERECTOMY  You will be given pain medicine.  You will need to have someone with you for the first 3 5 days after you go home.  You will need to follow up with your surgeon in 2 4 weeks after surgery to evaluate your progress.  You may have early menopause symptoms such as hot flashes,  night sweats, and insomnia.  If you had a hysterectomy for a problem that was not cancer or not a condition that could lead to cancer, then you no longer need Pap tests. However, even if you no longer need a Pap test, a regular exam is a good idea to make sure no other problems are starting. Document Released: 11/20/2000 Document Revised: 03/17/2013 Document Reviewed: 02/01/2013 [facility] Patient Information 2014 [place].

## 2013-10-06 ENCOUNTER — Encounter (HOSPITAL_COMMUNITY): Payer: Self-pay | Admitting: *Deleted

## 2013-10-06 ENCOUNTER — Observation Stay (HOSPITAL_COMMUNITY)
Admission: RE | Admit: 2013-10-06 | Discharge: 2013-10-07 | Disposition: A | Discharge status: Home / Self Care | Payer: Private Health Insurance - Indemnity | Source: Ambulatory Visit | Attending: Obstetrics & Gynecology | Admitting: Obstetrics & Gynecology

## 2013-10-06 ENCOUNTER — Encounter (HOSPITAL_COMMUNITY)
Admission: RE | Disposition: A | Discharge status: Home / Self Care | Payer: Self-pay | Source: Ambulatory Visit | Attending: Obstetrics & Gynecology

## 2013-10-06 ENCOUNTER — Inpatient Hospital Stay (HOSPITAL_COMMUNITY): Payer: Private Health Insurance - Indemnity | Admitting: Anesthesiology

## 2013-10-06 ENCOUNTER — Encounter (HOSPITAL_COMMUNITY): Payer: Private Health Insurance - Indemnity | Admitting: Anesthesiology

## 2013-10-06 DIAGNOSIS — N946 Dysmenorrhea, unspecified: Secondary | ICD-10-CM

## 2013-10-06 DIAGNOSIS — Z90710 Acquired absence of both cervix and uterus: Secondary | ICD-10-CM | POA: Diagnosis present

## 2013-10-06 DIAGNOSIS — N920 Excessive and frequent menstruation with regular cycle: Secondary | ICD-10-CM

## 2013-10-06 DIAGNOSIS — N803 Endometriosis of pelvic peritoneum, unspecified: Secondary | ICD-10-CM | POA: Insufficient documentation

## 2013-10-06 DIAGNOSIS — Z87891 Personal history of nicotine dependence: Secondary | ICD-10-CM | POA: Insufficient documentation

## 2013-10-06 DIAGNOSIS — E039 Hypothyroidism, unspecified: Secondary | ICD-10-CM | POA: Insufficient documentation

## 2013-10-06 DIAGNOSIS — K219 Gastro-esophageal reflux disease without esophagitis: Secondary | ICD-10-CM | POA: Insufficient documentation

## 2013-10-06 DIAGNOSIS — N72 Inflammatory disease of cervix uteri: Secondary | ICD-10-CM

## 2013-10-06 DIAGNOSIS — IMO0002 Reserved for concepts with insufficient information to code with codable children: Secondary | ICD-10-CM

## 2013-10-06 DIAGNOSIS — N840 Polyp of corpus uteri: Secondary | ICD-10-CM

## 2013-10-06 DIAGNOSIS — D251 Intramural leiomyoma of uterus: Secondary | ICD-10-CM

## 2013-10-06 HISTORY — PX: ABDOMINAL HYSTERECTOMY: SHX81

## 2013-10-06 HISTORY — PX: BILATERAL SALPINGECTOMY: SHX5743

## 2013-10-06 SURGERY — HYSTERECTOMY, ABDOMINAL
Anesthesia: General | Site: Abdomen

## 2013-10-06 MED ORDER — HYDROMORPHONE HCL PF 1 MG/ML IJ SOLN
0.5000 mg | INTRAMUSCULAR | Status: AC | PRN
  Administered 2013-10-06 (×4): 0.5 mg via INTRAVENOUS
  Filled 2013-10-06: qty 1

## 2013-10-06 MED ORDER — DOCUSATE SODIUM 100 MG PO CAPS
100.0000 mg | ORAL_CAPSULE | Freq: Two times a day (BID) | ORAL | Status: DC
Start: 2013-10-06 — End: 2013-10-07
  Administered 2013-10-06 – 2013-10-07 (×3): 100 mg via ORAL
  Filled 2013-10-06 (×3): qty 1

## 2013-10-06 MED ORDER — ROCURONIUM BROMIDE 100 MG/10ML IV SOLN
INTRAVENOUS | Status: DC | PRN
  Administered 2013-10-06: 25 mg via INTRAVENOUS
  Administered 2013-10-06: 5 mg via INTRAVENOUS
  Administered 2013-10-06 (×2): 10 mg via INTRAVENOUS

## 2013-10-06 MED ORDER — KCL IN DEXTROSE-NACL 20-5-0.45 MEQ/L-%-% IV SOLN
INTRAVENOUS | Status: DC
  Administered 2013-10-06: 15:00:00 via INTRAVENOUS

## 2013-10-06 MED ORDER — ONDANSETRON HCL 4 MG/2ML IJ SOLN: 4.0000 mg | Freq: Once | INTRAMUSCULAR | Status: DC | PRN

## 2013-10-06 MED ORDER — OXYCODONE-ACETAMINOPHEN 5-325 MG PO TABS
1.0000 | ORAL_TABLET | ORAL | Status: DC | PRN
  Administered 2013-10-06 (×2): 2 via ORAL
  Administered 2013-10-06: 1 via ORAL
  Administered 2013-10-07 (×2): 2 via ORAL
  Filled 2013-10-06 (×5): qty 2

## 2013-10-06 MED ORDER — NEOSTIGMINE METHYLSULFATE 1 MG/ML IJ SOLN
INTRAMUSCULAR | Status: AC
  Filled 2013-10-06: qty 1

## 2013-10-06 MED ORDER — HYDROMORPHONE HCL PF 1 MG/ML IJ SOLN
1.0000 mg | INTRAMUSCULAR | Status: DC | PRN
  Filled 2013-10-06: qty 2

## 2013-10-06 MED ORDER — SODIUM CHLORIDE 0.9 % IJ SOLN
INTRAMUSCULAR | Status: AC
Start: 2013-10-06 — End: 2013-10-06
  Filled 2013-10-06: qty 40

## 2013-10-06 MED ORDER — ROCURONIUM BROMIDE 50 MG/5ML IV SOLN
INTRAVENOUS | Status: AC
  Filled 2013-10-06: qty 1

## 2013-10-06 MED ORDER — 0.9 % SODIUM CHLORIDE (POUR BTL) OPTIME
TOPICAL | Status: DC | PRN
  Administered 2013-10-06: 2000 mL
  Administered 2013-10-06 (×2): 1000 mL

## 2013-10-06 MED ORDER — HYDROMORPHONE HCL PF 1 MG/ML IJ SOLN
INTRAMUSCULAR | Status: AC
  Filled 2013-10-06: qty 1

## 2013-10-06 MED ORDER — MIDAZOLAM HCL 2 MG/2ML IJ SOLN
INTRAMUSCULAR | Status: AC
  Filled 2013-10-06: qty 2

## 2013-10-06 MED ORDER — FENTANYL CITRATE 0.05 MG/ML IJ SOLN
INTRAMUSCULAR | Status: AC
  Filled 2013-10-06: qty 5

## 2013-10-06 MED ORDER — LIDOCAINE HCL 1 % IJ SOLN
INTRAMUSCULAR | Status: DC | PRN
  Administered 2013-10-06: 30 mg via INTRADERMAL

## 2013-10-06 MED ORDER — TRIAMCINOLONE ACETONIDE 55 MCG/ACT NA AERO
2.0000 | INHALATION_SPRAY | Freq: Every day | NASAL | Status: DC
  Filled 2013-10-06: qty 21.6

## 2013-10-06 MED ORDER — GLYCOPYRROLATE 0.2 MG/ML IJ SOLN
INTRAMUSCULAR | Status: AC
  Filled 2013-10-06: qty 2

## 2013-10-06 MED ORDER — MIDAZOLAM HCL 5 MG/5ML IJ SOLN
INTRAMUSCULAR | Status: DC | PRN
  Administered 2013-10-06: 2 mg via INTRAVENOUS

## 2013-10-06 MED ORDER — KETOROLAC TROMETHAMINE 30 MG/ML IJ SOLN
30.0000 mg | Freq: Once | INTRAMUSCULAR | Status: AC
  Administered 2013-10-06: 30 mg via INTRAMUSCULAR
  Filled 2013-10-06: qty 1

## 2013-10-06 MED ORDER — KETOROLAC TROMETHAMINE 30 MG/ML IJ SOLN
30.0000 mg | Freq: Once | INTRAMUSCULAR | Status: AC
  Administered 2013-10-06: 30 mg via INTRAVENOUS

## 2013-10-06 MED ORDER — SUCCINYLCHOLINE CHLORIDE 20 MG/ML IJ SOLN
INTRAMUSCULAR | Status: AC
  Filled 2013-10-06: qty 1

## 2013-10-06 MED ORDER — HYDROMORPHONE HCL PF 1 MG/ML IJ SOLN
0.5000 mg | INTRAMUSCULAR | Status: DC | PRN
  Administered 2013-10-06 (×3): 0.5 mg via INTRAVENOUS
  Filled 2013-10-06: qty 1

## 2013-10-06 MED ORDER — ONDANSETRON 8 MG/NS 50 ML IVPB
8.0000 mg | Freq: Four times a day (QID) | INTRAVENOUS | Status: DC | PRN
  Filled 2013-10-06: qty 8

## 2013-10-06 MED ORDER — SEVOFLURANE IN SOLN
RESPIRATORY_TRACT | Status: AC
  Filled 2013-10-06: qty 250

## 2013-10-06 MED ORDER — PROPOFOL 10 MG/ML IV BOLUS
INTRAVENOUS | Status: AC
  Filled 2013-10-06: qty 20

## 2013-10-06 MED ORDER — FENTANYL CITRATE 0.05 MG/ML IJ SOLN
25.0000 ug | INTRAMUSCULAR | Status: DC | PRN
  Administered 2013-10-06 (×4): 50 ug via INTRAVENOUS
  Filled 2013-10-06: qty 2

## 2013-10-06 MED ORDER — GLYCOPYRROLATE 0.2 MG/ML IJ SOLN
INTRAMUSCULAR | Status: DC | PRN
  Administered 2013-10-06: .5 mg via INTRAVENOUS

## 2013-10-06 MED ORDER — ONDANSETRON HCL 4 MG PO TABS: 8.0000 mg | ORAL_TABLET | Freq: Four times a day (QID) | ORAL | Status: DC | PRN

## 2013-10-06 MED ORDER — HYDROMORPHONE HCL PF 1 MG/ML IJ SOLN: 1.0000 mg | INTRAMUSCULAR | Status: DC | PRN

## 2013-10-06 MED ORDER — LEVOTHYROXINE SODIUM 112 MCG PO TABS
112.0000 ug | ORAL_TABLET | Freq: Every day | ORAL | Status: DC
  Filled 2013-10-06 (×2): qty 1

## 2013-10-06 MED ORDER — ALUM & MAG HYDROXIDE-SIMETH 200-200-20 MG/5ML PO SUSP: 30.0000 mL | ORAL | Status: DC | PRN

## 2013-10-06 MED ORDER — SODIUM CHLORIDE 0.9 % IJ SOLN
INTRAMUSCULAR | Status: DC | PRN
  Administered 2013-10-06: 20 mL via INTRAVENOUS

## 2013-10-06 MED ORDER — KETOROLAC TROMETHAMINE 30 MG/ML IJ SOLN
INTRAMUSCULAR | Status: AC
  Filled 2013-10-06: qty 1

## 2013-10-06 MED ORDER — ONDANSETRON 8 MG/NS 50 ML IVPB: 8.0000 mg | Freq: Four times a day (QID) | INTRAVENOUS | Status: DC | PRN

## 2013-10-06 MED ORDER — LIDOCAINE HCL (PF) 1 % IJ SOLN
INTRAMUSCULAR | Status: AC
  Filled 2013-10-06: qty 5

## 2013-10-06 MED ORDER — FENTANYL CITRATE 0.05 MG/ML IJ SOLN
INTRAMUSCULAR | Status: DC | PRN
  Administered 2013-10-06 (×10): 50 ug via INTRAVENOUS

## 2013-10-06 MED ORDER — KCL IN DEXTROSE-NACL 20-5-0.45 MEQ/L-%-% IV SOLN
INTRAVENOUS | Status: DC
  Administered 2013-10-06: 16:00:00 via INTRAVENOUS

## 2013-10-06 MED ORDER — ONDANSETRON HCL 4 MG/2ML IJ SOLN
INTRAMUSCULAR | Status: AC
  Filled 2013-10-06: qty 2

## 2013-10-06 MED ORDER — CEFAZOLIN SODIUM-DEXTROSE 2-3 GM-% IV SOLR
2.0000 g | INTRAVENOUS | Status: AC
  Administered 2013-10-06: 2 g via INTRAVENOUS

## 2013-10-06 MED ORDER — BUPIVACAINE LIPOSOME 1.3 % IJ SUSP
20.0000 mL | Freq: Once | INTRAMUSCULAR | Status: DC
  Filled 2013-10-06: qty 20

## 2013-10-06 MED ORDER — PROPOFOL 10 MG/ML IV BOLUS
INTRAVENOUS | Status: DC | PRN
  Administered 2013-10-06: 170 mg via INTRAVENOUS

## 2013-10-06 MED ORDER — BUPIVACAINE LIPOSOME 1.3 % IJ SUSP
INTRAMUSCULAR | Status: DC | PRN
  Administered 2013-10-06: 20 mL

## 2013-10-06 MED ORDER — LACTATED RINGERS IV SOLN
INTRAVENOUS | Status: DC | PRN
  Administered 2013-10-06 (×3): via INTRAVENOUS

## 2013-10-06 MED ORDER — CEFAZOLIN SODIUM-DEXTROSE 2-3 GM-% IV SOLR
INTRAVENOUS | Status: AC
  Filled 2013-10-06: qty 50

## 2013-10-06 MED ORDER — HYDROMORPHONE HCL PF 1 MG/ML IJ SOLN: 0.5000 mg | INTRAMUSCULAR | Status: DC | PRN

## 2013-10-06 MED ORDER — SUCCINYLCHOLINE CHLORIDE 20 MG/ML IJ SOLN
INTRAMUSCULAR | Status: DC | PRN
  Administered 2013-10-06: 120 mg via INTRAVENOUS

## 2013-10-06 MED ORDER — DOCUSATE SODIUM 100 MG PO CAPS: 100.0000 mg | ORAL_CAPSULE | Freq: Two times a day (BID) | ORAL | Status: DC

## 2013-10-06 MED ORDER — NEOSTIGMINE METHYLSULFATE 1 MG/ML IJ SOLN
INTRAMUSCULAR | Status: DC | PRN
Start: 2013-10-06 — End: 2013-10-06
  Administered 2013-10-06: 3 mg via INTRAVENOUS

## 2013-10-06 MED ORDER — FENTANYL CITRATE 0.05 MG/ML IJ SOLN
INTRAMUSCULAR | Status: AC
  Filled 2013-10-06: qty 2

## 2013-10-06 MED ORDER — OXYCODONE-ACETAMINOPHEN 5-325 MG PO TABS: 1.0000 | ORAL_TABLET | ORAL | Status: DC | PRN

## 2013-10-06 MED ORDER — ONDANSETRON HCL 4 MG/2ML IJ SOLN
INTRAMUSCULAR | Status: DC | PRN
Start: 2013-10-06 — End: 2013-10-06
  Administered 2013-10-06: 4 mg via INTRAVENOUS

## 2013-10-06 MED ORDER — FLUTICASONE PROPIONATE 50 MCG/ACT NA SUSP
2.0000 | Freq: Every day | NASAL | Status: DC
  Administered 2013-10-06 – 2013-10-07 (×2): 2 via NASAL
  Filled 2013-10-06: qty 16

## 2013-10-06 SURGICAL SUPPLY — 48 items
APPLIER CLIP 13 LRG OPEN (CLIP)
BAG HAMPER (MISCELLANEOUS) ×4 IMPLANT
BLADE 10 SAFETY STRL DISP (BLADE) ×4 IMPLANT
CELLS DAT CNTRL 66122 CELL SVR (MISCELLANEOUS) ×2 IMPLANT
CLIP APPLIE 13 LRG OPEN (CLIP) IMPLANT
CLOTH BEACON ORANGE TIMEOUT ST (SAFETY) ×4 IMPLANT
COVER LIGHT HANDLE STERIS (MISCELLANEOUS) ×8 IMPLANT
DERMABOND ADVANCED (GAUZE/BANDAGES/DRESSINGS) ×4
DERMABOND ADVANCED .7 DNX12 (GAUZE/BANDAGES/DRESSINGS) ×4 IMPLANT
DRAPE WARM FLUID 44X44 (DRAPE) ×4 IMPLANT
DRESSING TELFA 8X3 (GAUZE/BANDAGES/DRESSINGS) ×4 IMPLANT
ELECT REM PT RETURN 9FT ADLT (ELECTROSURGICAL) ×4
ELECTRODE REM PT RTRN 9FT ADLT (ELECTROSURGICAL) ×2 IMPLANT
FORMALIN 10 PREFIL 480ML (MISCELLANEOUS) ×4 IMPLANT
GLOVE BIOGEL PI IND STRL 8 (GLOVE) ×2 IMPLANT
GLOVE BIOGEL PI INDICATOR 8 (GLOVE) ×2
GLOVE ECLIPSE 6.5 STRL STRAW (GLOVE) ×8 IMPLANT
GLOVE ECLIPSE 8.0 STRL XLNG CF (GLOVE) ×4 IMPLANT
GLOVE INDICATOR 7.0 STRL GRN (GLOVE) ×16 IMPLANT
GOWN STRL REUS W/TWL LRG LVL3 (GOWN DISPOSABLE) ×8 IMPLANT
GOWN STRL REUS W/TWL XL LVL3 (GOWN DISPOSABLE) ×4 IMPLANT
INST SET MAJOR GENERAL (KITS) ×4 IMPLANT
KIT ROOM TURNOVER APOR (KITS) ×4 IMPLANT
MANIFOLD NEPTUNE II (INSTRUMENTS) ×4 IMPLANT
NEEDLE HYPO 21X1.5 SAFETY (NEEDLE) ×4 IMPLANT
NS IRRIG 1000ML POUR BTL (IV SOLUTION) ×20 IMPLANT
PACK ABDOMINAL MAJOR (CUSTOM PROCEDURE TRAY) ×4 IMPLANT
PAD ARMBOARD 7.5X6 YLW CONV (MISCELLANEOUS) ×4 IMPLANT
RETRACTOR WND ALEXIS 25 LRG (MISCELLANEOUS) IMPLANT
RTRCTR WOUND ALEXIS 18CM MED (MISCELLANEOUS) ×4
RTRCTR WOUND ALEXIS 25CM LRG (MISCELLANEOUS)
SEPRAFILM MEMBRANE 5X6 (MISCELLANEOUS) IMPLANT
SET BASIN LINEN APH (SET/KITS/TRAYS/PACK) ×4 IMPLANT
STAPLER VISISTAT 35W (STAPLE) IMPLANT
SUT CHROMIC 0 CT 1 (SUTURE) ×4 IMPLANT
SUT MNCRL+ AB 3-0 CT1 36 (SUTURE) ×4 IMPLANT
SUT MON AB 3-0 SH 27 (SUTURE) ×4 IMPLANT
SUT MONOCRYL AB 3-0 CT1 36IN (SUTURE) ×4
SUT PLAIN 2 0 XLH (SUTURE) IMPLANT
SUT VIC AB 0 CT1 27 (SUTURE) ×6
SUT VIC AB 0 CT1 27XBRD ANTBC (SUTURE) IMPLANT
SUT VIC AB 0 CT1 27XCR 8 STRN (SUTURE) ×6 IMPLANT
SUT VIC AB 0 CTX 36 (SUTURE) ×2
SUT VIC AB 0 CTX36XBRD ANTBCTR (SUTURE) ×2 IMPLANT
SUT VICRYL 3 0 (SUTURE) ×4 IMPLANT
SYR 20CC LL (SYRINGE) ×4 IMPLANT
TOWEL BLUE STERILE X RAY DET (MISCELLANEOUS) IMPLANT
TRAY FOLEY CATH 16FR SILVER (SET/KITS/TRAYS/PACK) ×4 IMPLANT

## 2013-10-06 NOTE — Progress Notes (Signed)
Pt transferred to floor from PACU. Pt is in NAD, will continue to monitor.

## 2013-10-06 NOTE — Transfer of Care (Signed)
Immediate Anesthesia Transfer of Care Note  Patient: Catherine [name]  Procedure(s) Performed: Procedure(s): HYSTERECTOMY ABDOMINAL (N/A) SALPINGO OOPHORECTOMY (Bilateral)  Patient Location: PACU  Anesthesia Type:General  Level of Consciousness: awake  Airway & Oxygen Therapy: Patient Spontanous Breathing and Patient connected to face mask oxygen  Post-op Assessment: Report given to PACU RN  Post vital signs: Reviewed and stable  Complications: No apparent anesthesia complications

## 2013-10-06 NOTE — Progress Notes (Signed)
Removed foley per MD order. Pt DTV at [place]. Will continue to monitor.

## 2013-10-06 NOTE — Progress Notes (Signed)
Pt has had two IVs infiltrate since she has come up from surgery. I have tried to get an IV and the [facility] has attempted to get an IV, but we have both been unsuccessful. MD was called and he is aware. He stated that it is ok to leave IV out as long as pt is tolerating fluid intake PO and she is. Pt is also complaining of pain and MD was agreeable to a one time order of toradol 30mg  IM. Will continue to monitor.

## 2013-10-06 NOTE — H&P (Signed)
Preoperative History and Physical  Catherine [name] is a 38 y.o. G1P0010 with Patient's last menstrual period was 09/18/2013. admitted for a abdominal hysterectomy.  Patient has been having increasing trouble with dysmenorrhea, menorrhagia and dyspareunia.  We tried a trial of megace without success.  Sonogram is normal. Dyspareunia is 75% of the time, with thrusting.  Discussed options and patient understands the only way to alleviate her dyspareunia is with removal of cervix.  As a result proceed with abdominal hysterectomy  PMH:    Past Medical History  Diagnosis Date  . Hypothyroidism   . GERD (gastroesophageal reflux disease)     PSH:     Past Surgical History  Procedure Laterality Date  . Bladder surgery      reports had bladder stretched as child  . Wisdom tooth extraction      POb/GynH:      OB History   Grav Para Term Preterm Abortions TAB SAB Ect Mult Living   1 0 0 0 1 0 1 0 0 0       SH:   History  Substance Use Topics  . Smoking status: Former Smoker -- 0.50 packs/day for 19 years    Types: Cigarettes    Quit date: 10/08/2011  . Smokeless tobacco: Never Used  . Alcohol Use: 0.0 oz/week     Comment: very rarley    FH:    Family History  Problem Relation Age of Onset  . Heart disease Father   . Hypertension Father   . Cancer Maternal Aunt   . Thyroid disease Maternal Aunt   . Cancer Paternal Aunt   . Thyroid disease Maternal Grandmother   . Cancer Maternal Grandmother   . Diabetes Maternal Grandfather   . Hypertension Maternal Grandfather      Allergies:  Allergies  Allergen Reactions  . Morphine And Related Other (See Comments)    headache  . Sulfa Antibiotics Other (See Comments)    Gives pt Yeast infection    Medications:      Current facility-administered medications:bupivacaine liposome (EXPAREL) 1.3 % injection 266 mg, 20 mL, Infiltration, Once, Florian Buff, MD;  ceFAZolin (ANCEF) IVPB 2 g/50 mL premix, 2 g, Intravenous, On Call to OR,  Florian Buff, MD;  ketorolac (TORADOL) 30 MG/ML injection 30 mg, 30 mg, Intravenous, Once, Florian Buff, MD  Review of Systems:   Review of Systems  Constitutional: Negative for fever, chills, weight loss, malaise/fatigue and diaphoresis.  HENT: Negative for hearing loss, ear pain, nosebleeds, congestion, sore throat, neck pain, tinnitus and ear discharge.   Eyes: Negative for blurred vision, double vision, photophobia, pain, discharge and redness.  Respiratory: Negative for cough, hemoptysis, sputum production, shortness of breath, wheezing and stridor.   Cardiovascular: Negative for chest pain, palpitations, orthopnea, claudication, leg swelling and PND.  Gastrointestinal: Positive for abdominal pain. Negative for heartburn, nausea, vomiting, diarrhea, constipation, blood in stool and melena.  Genitourinary: Negative for dysuria, urgency, frequency, hematuria and flank pain.  Musculoskeletal: Negative for myalgias, back pain, joint pain and falls.  Skin: Negative for itching and rash.  Neurological: Negative for dizziness, tingling, tremors, sensory change, speech change, focal weakness, seizures, loss of consciousness, weakness and headaches.  Endo/Heme/Allergies: Negative for environmental allergies and polydipsia. Does not bruise/bleed easily.  Psychiatric/Behavioral: Negative for depression, suicidal ideas, hallucinations, memory loss and substance abuse. The patient is not nervous/anxious and does not have insomnia.      PHYSICAL EXAM:  Blood pressure 130/89, pulse 69, temperature 98.6 F (  37 C), temperature source Oral, resp. rate 20, height 5\' 9"  (1.753 m), weight 229 lb (103.874 kg), last menstrual period 09/18/2013, SpO2 98.00%.    Vitals reviewed. Constitutional: She is oriented to person, place, and time. She appears well-developed and well-nourished.  HENT:  Head: Normocephalic and atraumatic.  Right Ear: External ear normal.  Left Ear: External ear normal.  Nose: Nose  normal.  Mouth/Throat: Oropharynx is clear and moist.  Eyes: Conjunctivae and EOM are normal. Pupils are equal, round, and reactive to light. Right eye exhibits no discharge. Left eye exhibits no discharge. No scleral icterus.  Neck: Normal range of motion. Neck supple. No tracheal deviation present. No thyromegaly present.  Cardiovascular: Normal rate, regular rhythm, normal heart sounds and intact distal pulses.  Exam reveals no gallop and no friction rub.   No murmur heard. Respiratory: Effort normal and breath sounds normal. No respiratory distress. She has no wheezes. She has no rales. She exhibits no tenderness.  GI: Soft. Bowel sounds are normal. She exhibits no distension and no mass. There is tenderness. There is no rebound and no guarding.  Genitourinary:       Vulva is normal without lesions Vagina is pink moist without discharge Cervix normal in appearance and pap is normal, positive pain with cervical manipulation Uterus is nmormal by sopnogram Adnexa is negative with normal sized ovaries by sonogram  Musculoskeletal: Normal range of motion. She exhibits no edema and no tenderness.  Neurological: She is alert and oriented to person, place, and time. She has normal reflexes. She displays normal reflexes. No cranial nerve deficit. She exhibits normal muscle tone. Coordination normal.  Skin: Skin is warm and dry. No rash noted. No erythema. No pallor.  Psychiatric: She has a normal mood and affect. Her behavior is normal. Judgment and thought content normal.    Labs: Results for orders placed during the hospital encounter of 10/01/13 (from the past 336 hour(s))  CBC   Collection Time    10/01/13 12:40 PM      Result Value Ref Range   WBC 5.3  4.0 - 10.5 K/uL   RBC 4.27  3.87 - 5.11 MIL/uL   Hemoglobin 12.7  12.0 - 15.0 g/dL   HCT 38.0  36.0 - 46.0 %   MCV 89.0  78.0 - 100.0 fL   MCH 29.7  26.0 - 34.0 pg   MCHC 33.4  30.0 - 36.0 g/dL   RDW 13.3  11.5 - 15.5 %   Platelets  251  150 - 400 K/uL  COMPREHENSIVE METABOLIC PANEL   Collection Time    10/01/13 12:40 PM      Result Value Ref Range   Sodium 142  137 - 147 mEq/L   Potassium 4.6  3.7 - 5.3 mEq/L   Chloride 106  96 - 112 mEq/L   CO2 26  19 - 32 mEq/L   Glucose, Bld 92  70 - 99 mg/dL   BUN 12  6 - 23 mg/dL   Creatinine, Ser 1.02  0.50 - 1.10 mg/dL   Calcium 9.9  8.4 - 10.5 mg/dL   Total Protein 7.5  6.0 - 8.3 g/dL   Albumin 3.9  3.5 - 5.2 g/dL   AST 12  0 - 37 U/L   ALT 11  0 - 35 U/L   Alkaline Phosphatase 53  39 - 117 U/L   Total Bilirubin 0.3  0.3 - 1.2 mg/dL   GFR calc non Af Amer 70 (*) >90 mL/min  GFR calc Af Amer 81 (*) >90 mL/min  HCG, QUANTITATIVE, PREGNANCY   Collection Time    10/01/13 12:40 PM      Result Value Ref Range   hCG, Beta Chain, Quant, S <1  <5 mIU/mL  TYPE AND SCREEN   Collection Time    10/01/13 12:40 PM      Result Value Ref Range   ABO/RH(D) A POS     Antibody Screen NEG     Sample Expiration 10/15/2013    URINALYSIS, ROUTINE W REFLEX MICROSCOPIC   Collection Time    10/01/13 12:41 PM      Result Value Ref Range   Color, Urine YELLOW  YELLOW   APPearance CLEAR  CLEAR   Specific Gravity, Urine >1.030 (*) 1.005 - 1.030   pH 6.0  5.0 - 8.0   Glucose, UA NEGATIVE  NEGATIVE mg/dL   Hgb urine dipstick LARGE (*) NEGATIVE   Bilirubin Urine NEGATIVE  NEGATIVE   Ketones, ur NEGATIVE  NEGATIVE mg/dL   Protein, ur NEGATIVE  NEGATIVE mg/dL   Urobilinogen, UA 0.2  0.0 - 1.0 mg/dL   Nitrite POSITIVE (*) NEGATIVE   Leukocytes, UA NEGATIVE  NEGATIVE  URINE MICROSCOPIC-ADD ON   Collection Time    10/01/13 12:41 PM      Result Value Ref Range   Squamous Epithelial / LPF RARE  RARE   WBC, UA 0-2  <3 WBC/hpf   RBC / HPF 3-6  <3 RBC/hpf   Bacteria, UA RARE  RARE    EKG: No orders found for this or any previous visit.  Imaging Studies: US Transvaginal Non-ob  10/14/2013   GYNECOLOGIC SONOGRAM   Catherine [name] is a 38 y.o. G1P0010 LMP 09/18/2013(spotting) for a  pelvic  sonogram for dysmenorrhea, dyspareunia and menorrhagia. Pt currently  taking Megace.  Uterus                      7.7 x 4.6 x 4.7 cm, anteverted uterus no  myometrial masses noted   Endometrium          12.0 mm, asymmetrical, hyperechoic area noted within  cavity=7+mm  Right ovary             2.6 x 2.2 x 1.5 cm,   Left ovary                3.2 x 2.7 x 1.7 cm,   No free fluid or adnexal masses noted within pelvis  Technician Comments:  Anteverted uterus noted, ?endometrial polyp noted within cavity(7+mm),  bilateral adnexa/ovaries appear WNL, no free fluid or adnexal masses noted     Alicia Amel 10/14/13 11:44 AM  Clinical Impression and recommendations:  I have reviewed the sonogram results above, combined with the patient's  current clinical course, below are my impressions and any appropriate  recommendations for management based on the sonographic findings.  Normal pelvic anatomy, no anatomical etiology for patient's symptoms Small endometrial polyp not clinically significant  Florian Buff 2013-10-14 11:58 AM         Assessment: Patient Active Problem List   Diagnosis Date Noted  . Dyspareunia Oct 14, 2013  . Excessive or frequent menstruation Oct 14, 2013    Plan: Abdominal hysterctomy  Florian Buff 10/06/2013 7:23 AM

## 2013-10-06 NOTE — Op Note (Signed)
Preoperative diagnosis:  1.  Dyspareunia                                          2.  Dysmenorrhea                                         3.  Menorrhagia                                           Postoperative diagnosis:  Same as above + endometriosis and left abdominal wall hernia  Procedure:  Abdominal hysterectomy with removal of both Fallopian tubes  Surgeon:  Florian Buff  Assistant:    Anesthesia:  General endotracheal  Preoperative clinical summary:  Dyspareunia dysmenorrhea menorraghia unresponsive to conservative management, normal sonogram  Intraoperative findings: posterior cul de sac endometriosis, small left hernia  Description of operation:  Patient was taken to the operating room and placed in the supine position where she underwent general endotracheal anesthesia.  She was then prepped and draped in the usual sterile fashion and a Foley catheter was placed for continuous bladder drainage.  A Pfannenstiel skin incision was made and carried down sharply to the rectus fascia which was scored in the midline and extended laterally.  The fascia was taken off the muscles superiorly and inferiorly without difficulty.  The muscles were divided.  The peritoneal cavity was entered.  An medium Alexis self-retaining retractor was placed.  The upper abdomen was packed away. Both uterine cornu were grasped with Coker clamps.  The left round ligament was suture ligated and coagulated with the electrocautery unit.  The left vesicouterine serosal flap was created.  An avascular window in in the peritoneum was created and the utero-ovarian ligament was cross clamped, cut and suture ligated.  The right round ligament was suture ligated and cut with the electrocautery unit.  The vesicouterine serosal flap on the right was created.  An avascular window in the peritoneum was created and the right utero-ovarian ligament was cross clamped, cut and double suture ligated.  Thus both ovaries were preserved.   The uterine vessels were skeletonized bilaterally.  The uterine vessels were clamped bilaterally,  then cut and suture ligated.  Two more pedicles were taken down the cervix medial to the uterine vessels.  Each pedicle was clamped cut and suture ligated with good resulting hemostasis.  The vagina was cross clamped and the uterus and cervix was removed.  The pelvis was irrigated vigorously and all pedicles were examined and found to be hemostatic.  Both Fallopian tubes were removed without difficulty.  The pelvic peritoneum was closed anterior to posterior in a running fashion.  All specimens were sent to pathology for routine evaluation.  The Alexis self-retaining retractor was removed and the pelvis was irrigated vigorously.  All packs were removed and all counts were correct at this point x 3.  The muscles and peritoneum were reapproximated loosely.  The fascia was closed with 0 Vicryl running.  The subcutaneous tissue was reapproximated using 2-0 plain gut.  Exparel 20 cc diluted with 20 cc NS was injected into the subcutaneous fat.  The skin was closed using 3-0 Vicryl on a Keith needle  in a subcuticular fashion.  Dermabond was then applied for additional wound integrity and to serve as a postoperative bacterial barrier.  The patient was awakened from anesthesia taken to the recovery room in good stable condition. All sponge instrument and needle counts were correct x 3.  The patient received Ancef and Toradol prophylactically preoperatively.  Estimated blood loss for the procedure was 150  cc.  EURE,LUTHER H 10/06/2013 10:01 AM

## 2013-10-06 NOTE — Anesthesia Procedure Notes (Signed)
Procedure Name: Intubation Date/Time: 10/06/2013 7:58 AM Performed by: Tressie Stalker E Pre-anesthesia Checklist: Patient identified, Patient being monitored, Timeout performed, Emergency Drugs available and Suction available Patient Re-evaluated:Patient Re-evaluated prior to inductionOxygen Delivery Method: Circle System Utilized Preoxygenation: Pre-oxygenation with 100% oxygen Intubation Type: IV induction Ventilation: Mask ventilation without difficulty Laryngoscope Size: Mac and 3 Grade View: Grade I Tube type: Oral Tube size: 7.0 mm Number of attempts: 1 Airway Equipment and Method: stylet Placement Confirmation: ETT inserted through vocal cords under direct vision,  positive ETCO2 and breath sounds checked- equal and bilateral Secured at: 21 cm Tube secured with: Tape Dental Injury: Teeth and Oropharynx as per pre-operative assessment

## 2013-10-06 NOTE — Anesthesia Postprocedure Evaluation (Signed)
  Anesthesia Post-op Note  Patient: Catherine [name]  Procedure(s) Performed: Procedure(s): HYSTERECTOMY ABDOMINAL (N/A) SALPINGO OOPHORECTOMY (Bilateral)  Patient Location: PACU  Anesthesia Type:General  Level of Consciousness: awake, alert  and oriented  Airway and Oxygen Therapy: Patient Spontanous Breathing and Patient connected to face mask oxygen  Post-op Pain: mild  Post-op Assessment: Post-op Vital signs reviewed, Patient's Cardiovascular Status Stable, Respiratory Function Stable, Patent Airway and No signs of Nausea or vomiting  Post-op Vital Signs: Reviewed and stable  Last Vitals:  Filed Vitals:   10/06/13 0655  BP: 130/89  Pulse:   Temp:   Resp: 20    Complications: No apparent anesthesia complications

## 2013-10-06 NOTE — Anesthesia Preprocedure Evaluation (Addendum)
Anesthesia Evaluation  Patient identified by MRN, date of birth, ID band Patient awake    Reviewed: Allergy & Precautions, H&P , NPO status , Patient's Chart, lab work & pertinent test results  Airway Mallampati: II TM Distance: <3 FB Neck ROM: Full    Dental  (+) Teeth Intact   Pulmonary neg pulmonary ROS, former smoker,  breath sounds clear to auscultation        Cardiovascular negative cardio ROS  Rhythm:Regular Rate:Normal     Neuro/Psych    GI/Hepatic GERD-  Controlled,Reflux just with certain foods, no problems recently.   Endo/Other  Hypothyroidism   Renal/GU      Musculoskeletal   Abdominal   Peds  Hematology   Anesthesia Other Findings   Reproductive/Obstetrics                          Anesthesia Physical Anesthesia Plan  ASA: II  Anesthesia Plan: General   Post-op Pain Management:    Induction: Intravenous  Airway Management Planned: Oral ETT  Additional Equipment:   Intra-op Plan:   Post-operative Plan: Extubation in OR  Informed Consent: I have reviewed the patients History and Physical, chart, labs and discussed the procedure including the risks, benefits and alternatives for the proposed anesthesia with the patient or authorized representative who has indicated his/her understanding and acceptance.     Plan Discussed with: Anesthesiologist  Anesthesia Plan Comments:         Anesthesia Quick Evaluation

## 2013-10-07 ENCOUNTER — Encounter (HOSPITAL_COMMUNITY): Payer: Self-pay | Admitting: Obstetrics & Gynecology

## 2013-10-07 LAB — BASIC METABOLIC PANEL
BUN: 9 mg/dL (ref 6–23)
CO2: 25 mEq/L (ref 19–32)
Calcium: 8.9 mg/dL (ref 8.4–10.5)
Chloride: 104 mEq/L (ref 96–112)
Creatinine, Ser: 0.96 mg/dL (ref 0.50–1.10)
GFR, EST AFRICAN AMERICAN: 87 mL/min — AB (ref >90)
GFR, EST NON AFRICAN AMERICAN: 75 mL/min — AB (ref >90)
Glucose, Bld: 109 mg/dL — ABNORMAL HIGH (ref 70–99)
Potassium: 3.7 mEq/L (ref 3.7–5.3)
SODIUM: 139 meq/L (ref 137–147)

## 2013-10-07 LAB — CBC
HCT: 34.1 % — ABNORMAL LOW (ref 36.0–46.0)
HEMOGLOBIN: 11.2 g/dL — AB (ref 12.0–15.0)
MCH: 29.2 pg (ref 26.0–34.0)
MCHC: 32.8 g/dL (ref 30.0–36.0)
MCV: 89 fL (ref 78.0–100.0)
Platelets: 219 10*3/uL (ref 150–400)
RBC: 3.83 MIL/uL — ABNORMAL LOW (ref 3.87–5.11)
RDW: 13.3 % (ref 11.5–15.5)
WBC: 9.4 10*3/uL (ref 4.0–10.5)

## 2013-10-07 MED ORDER — KETOROLAC TROMETHAMINE 10 MG PO TABS: 10.0000 mg | ORAL_TABLET | Freq: Three times a day (TID) | ORAL | Status: DC | PRN

## 2013-10-07 MED ORDER — OXYCODONE-ACETAMINOPHEN 7.5-325 MG PO TABS: 1.0000 | ORAL_TABLET | Freq: Four times a day (QID) | ORAL | Status: DC | PRN

## 2013-10-07 MED ORDER — ONDANSETRON HCL 8 MG PO TABS: 8.0000 mg | ORAL_TABLET | Freq: Four times a day (QID) | ORAL | Status: DC | PRN

## 2013-10-07 NOTE — Progress Notes (Signed)
Pt is to be discharged home today. Pt is in NAD, IV is out, all paperwork has been reviewed/discussed with patient, and there are no questions/concerns at this time. Assessment is unchanged from this morning. Pt is to be accompanied downstairs by staff and family via wheelchair.  

## 2013-10-07 NOTE — Anesthesia Postprocedure Evaluation (Signed)
  Anesthesia Post-op Note  Patient: Catherine [name]  Procedure(s) Performed: Procedure(s): HYSTERECTOMY ABDOMINAL (N/A) BILATERAL SALPINGECTOMY (Bilateral)  Patient Location: room 213  Anesthesia Type:General  Level of Consciousness: awake, alert , oriented and patient cooperative  Airway and Oxygen Therapy: Patient Spontanous Breathing  Post-op Pain: 6 /10, moderate  Post-op Assessment: Post-op Vital signs reviewed, Patient's Cardiovascular Status Stable, Respiratory Function Stable, Patent Airway, No signs of Nausea or vomiting, Adequate PO intake and Pain level controlled  Post-op Vital Signs: Reviewed and stable  Last Vitals:  Filed Vitals:   10/07/13 0645  BP: 127/82  Pulse: 66  Temp: 36.7 C  Resp: 18    Complications: No apparent anesthesia complications

## 2013-10-07 NOTE — Addendum Note (Signed)
Addendum created 10/07/13 9794 by Charmaine Downs, CRNA   Modules edited: Notes Section   Notes Section:  File: [id number]

## 2013-10-07 NOTE — Discharge Instructions (Signed)
Hysterectomy Information  A hysterectomy is a surgery in which your uterus is removed. This surgery may be done to treat various medical problems. After the surgery, you will no longer have menstrual periods. The surgery will also make you unable to become pregnant (sterile). The fallopian tubes and ovaries can be removed (bilateral salpingo-oophorectomy) during this surgery as well.  REASONS FOR A HYSTERECTOMY  Persistent, abnormal bleeding.  Lasting (chronic) pelvic pain or infection.  The lining of the uterus (endometrium) starts growing outside the uterus (endometriosis).  The endometrium starts growing in the muscle of the uterus (adenomyosis).  The uterus falls down into the vagina (pelvic organ prolapse).  Noncancerous growths in the uterus (uterine fibroids) that cause symptoms.  Precancerous cells.  Cervical cancer or uterine cancer. TYPES OF HYSTERECTOMIES  Supracervical hysterectomy In this type, the top part of the uterus is removed, but not the cervix.  Total hysterectomy The uterus and cervix are removed.  Radical hysterectomy The uterus, the cervix, and the fibrous tissue that holds the uterus in place in the pelvis (parametrium) are removed. WAYS A HYSTERECTOMY CAN BE PERFORMED  Abdominal hysterectomy A large surgical cut (incision) is made in the abdomen. The uterus is removed through this incision.  Vaginal hysterectomy An incision is made in the vagina. The uterus is removed through this incision. There are no abdominal incisions.  Conventional laparoscopic hysterectomy Three or four small incisions are made in the abdomen. A thin, lighted tube with a camera (laparoscope) is inserted into one of the incisions. Other tools are put through the other incisions. The uterus is cut into small pieces. The small pieces are removed through the incisions, or they are removed through the vagina.  Laparoscopically assisted vaginal hysterectomy (LAVH) Three or four small  incisions are made in the abdomen. Part of the surgery is performed laparoscopically and part vaginally. The uterus is removed through the vagina.  Robot-assisted laparoscopic hysterectomy A laparoscope and other tools are inserted into 3 or 4 small incisions in the abdomen. A computer-controlled device is used to give the surgeon a 3D image and to help control the surgical instruments. This allows for more precise movements of surgical instruments. The uterus is cut into small pieces and removed through the incisions or removed through the vagina. RISKS AND COMPLICATIONS  Possible complications associated with this procedure include:  Bleeding and risk of blood transfusion. Tell your health care provider if you do not want to receive any blood products.  Blood clots in the legs or lung.  Infection.  Injury to surrounding organs.  Problems or side effects related to anesthesia.  Conversion to an abdominal hysterectomy from one of the other techniques. WHAT TO EXPECT AFTER A HYSTERECTOMY  You will be given pain medicine.  You will need to have someone with you for the first 3 5 days after you go home.  You will need to follow up with your surgeon in 2 4 weeks after surgery to evaluate your progress.  You may have early menopause symptoms such as hot flashes, night sweats, and insomnia.  If you had a hysterectomy for a problem that was not cancer or not a condition that could lead to cancer, then you no longer need Pap tests. However, even if you no longer need a Pap test, a regular exam is a good idea to make sure no other problems are starting. Document Released: 11/20/2000 Document Revised: 03/17/2013 Document Reviewed: 02/01/2013 [employer] Patient Information 2014 [employer].  

## 2013-10-13 ENCOUNTER — Encounter: Payer: Self-pay | Admitting: Obstetrics & Gynecology

## 2013-10-13 ENCOUNTER — Ambulatory Visit (INDEPENDENT_AMBULATORY_CARE_PROVIDER_SITE_OTHER): Payer: Managed Care, Other (non HMO) | Admitting: Obstetrics & Gynecology

## 2013-10-13 VITALS — BP 130/80 | Wt 227.0 lb

## 2013-10-13 DIAGNOSIS — Z90710 Acquired absence of both cervix and uterus: Secondary | ICD-10-CM

## 2013-10-13 DIAGNOSIS — Z029 Encounter for administrative examinations, unspecified: Secondary | ICD-10-CM

## 2013-10-13 DIAGNOSIS — Z9889 Other specified postprocedural states: Secondary | ICD-10-CM

## 2013-10-13 MED ORDER — KETOROLAC TROMETHAMINE 10 MG PO TABS: 10.0000 mg | ORAL_TABLET | Freq: Three times a day (TID) | ORAL | Status: DC | PRN

## 2013-10-13 MED ORDER — OXYCODONE-ACETAMINOPHEN 7.5-325 MG PO TABS: 1.0000 | ORAL_TABLET | Freq: Four times a day (QID) | ORAL | Status: DC | PRN

## 2013-10-13 NOTE — Progress Notes (Signed)
Patient ID: Catherine [name], female   DOB: [birth year], 38 y.o.   MRN: [id number] POD #8 abdominal hysterectomy No bleeding 1 BM last Thursday  Incision Bruising but looks good  Dulcolax suppositories Percocet

## 2013-11-01 NOTE — Discharge Summary (Signed)
Physician Discharge Summary  Patient ID: Catherine [name] MRN: [id number] DOB/AGE: 1976/02/20 38 y.o.  Admit date: 10/06/2013 Discharge date: 11/01/2013  Admission Diagnoses: Abdominal hysterectomy and removal of Fallopian tubes  Discharge Diagnoses:  Active Problems:   S/P hysterectomy   Discharged Condition: stable  Hospital Course: unremarkable  Consults: None  Significant Diagnostic Studies: none  Treatments: surgery: as above  Discharge Exam: Blood pressure 127/82, pulse 66, temperature 98.1 F (36.7 C), temperature source Oral, resp. rate 18, height 5\' 9"  (1.753 m), weight 229 lb (103.874 kg), last menstrual period 09/18/2013, SpO2 97.00%. General appearance: alert, cooperative and no distress GI: soft, non-tender; bowel sounds normal; no masses,  no organomegaly Incision/Wound:dry intact  Disposition: 01-Home or Self Care  Discharge Instructions   Call MD for:  persistant nausea and vomiting    Complete by:  As directed      Call MD for:  severe uncontrolled pain    Complete by:  As directed      Call MD for:  temperature >100.4    Complete by:  As directed      Diet - low sodium heart healthy    Complete by:  As directed      Discharge wound care:    Complete by:  As directed   Keep clean dry     Driving Restrictions    Complete by:  As directed   None this week     Increase activity slowly    Complete by:  As directed      Lifting restrictions    Complete by:  As directed   None more than 8 pounds     Sexual Activity Restrictions    Complete by:  As directed   You must be joking            Medication List    STOP taking these medications       HYDROcodone-acetaminophen 5-325 MG per tablet  Commonly known as:  NORCO/VICODIN     ibuprofen 200 MG tablet  Commonly known as:  ADVIL,MOTRIN     megestrol 40 MG tablet  Commonly known as:  MEGACE      TAKE these medications       levothyroxine 112 MCG tablet  Commonly known as:  SYNTHROID,  LEVOTHROID  Take 112 mcg by mouth daily before breakfast.     NASACORT ALLERGY 24HR 55 MCG/ACT Aero nasal inhaler  Generic drug:  triamcinolone  Place 2 sprays into the nose daily.     ondansetron 8 MG tablet  Commonly known as:  ZOFRAN  Take 1 tablet (8 mg total) by mouth every 6 (six) hours as needed for nausea.           Follow-up Information   Follow up with Florian Buff, MD In 1 week.   Specialties:  Obstetrics and Gynecology, Radiology   Contact information:   [place] [address] [phone]       Signed: Florian Buff

## 2013-11-10 ENCOUNTER — Ambulatory Visit (INDEPENDENT_AMBULATORY_CARE_PROVIDER_SITE_OTHER): Payer: Managed Care, Other (non HMO) | Admitting: Obstetrics & Gynecology

## 2013-11-10 ENCOUNTER — Encounter: Payer: Self-pay | Admitting: Obstetrics & Gynecology

## 2013-11-10 VITALS — BP 128/80 | Wt 228.4 lb

## 2013-11-10 DIAGNOSIS — K219 Gastro-esophageal reflux disease without esophagitis: Secondary | ICD-10-CM

## 2013-11-10 DIAGNOSIS — N760 Acute vaginitis: Secondary | ICD-10-CM | POA: Insufficient documentation

## 2013-11-10 MED ORDER — PANTOPRAZOLE SODIUM 40 MG PO TBEC: 40.0000 mg | DELAYED_RELEASE_TABLET | Freq: Every day | ORAL | Status: DC

## 2013-11-10 MED ORDER — METRONIDAZOLE 0.75 % VA GEL
VAGINAL | Status: DC
Start: 2013-11-10 — End: 2013-11-22

## 2013-11-10 NOTE — Progress Notes (Signed)
Patient ID: THERSIA PETRAGLIA, female   DOB: 20-Dec-1975, 39 y.o.   MRN: [id number] 5 week post op visit from abdominal hysterectomy with removal of both tubes Incision clean dry intact Back at work doing well working at home No complaints No sex yet  Incision looks great  Cuff healing well Needs 2 more weeks of heal time though  Follow up prn  Metronidazole for BV Having breakthrough symptoms on omeperazole, will switch to prevaced

## 2013-11-15 ENCOUNTER — Telehealth: Payer: Self-pay | Admitting: Obstetrics and Gynecology

## 2013-11-17 NOTE — Telephone Encounter (Signed)
Left message letting pt know note was done stating no restrictions, and faxed to #, Dalbert Batman. [place]

## 2013-11-22 ENCOUNTER — Encounter (HOSPITAL_COMMUNITY): Payer: Self-pay | Admitting: Emergency Medicine

## 2013-11-22 ENCOUNTER — Emergency Department (HOSPITAL_COMMUNITY)
Admission: EM | Admit: 2013-11-22 | Discharge: 2013-11-22 | Disposition: A | Discharge status: Home / Self Care | Payer: Managed Care, Other (non HMO) | Attending: Emergency Medicine | Admitting: Emergency Medicine

## 2013-11-22 ENCOUNTER — Other Ambulatory Visit: Payer: Self-pay

## 2013-11-22 ENCOUNTER — Emergency Department (HOSPITAL_COMMUNITY): Payer: Managed Care, Other (non HMO)

## 2013-11-22 DIAGNOSIS — R1011 Right upper quadrant pain: Secondary | ICD-10-CM | POA: Insufficient documentation

## 2013-11-22 DIAGNOSIS — Z3202 Encounter for pregnancy test, result negative: Secondary | ICD-10-CM | POA: Insufficient documentation

## 2013-11-22 DIAGNOSIS — Z79899 Other long term (current) drug therapy: Secondary | ICD-10-CM | POA: Insufficient documentation

## 2013-11-22 DIAGNOSIS — R110 Nausea: Secondary | ICD-10-CM | POA: Insufficient documentation

## 2013-11-22 DIAGNOSIS — IMO0002 Reserved for concepts with insufficient information to code with codable children: Secondary | ICD-10-CM | POA: Insufficient documentation

## 2013-11-22 DIAGNOSIS — R1013 Epigastric pain: Secondary | ICD-10-CM | POA: Insufficient documentation

## 2013-11-22 DIAGNOSIS — K8020 Calculus of gallbladder without cholecystitis without obstruction: Secondary | ICD-10-CM | POA: Insufficient documentation

## 2013-11-22 DIAGNOSIS — E039 Hypothyroidism, unspecified: Secondary | ICD-10-CM | POA: Insufficient documentation

## 2013-11-22 DIAGNOSIS — Z90710 Acquired absence of both cervix and uterus: Secondary | ICD-10-CM | POA: Insufficient documentation

## 2013-11-22 DIAGNOSIS — Z9089 Acquired absence of other organs: Secondary | ICD-10-CM | POA: Insufficient documentation

## 2013-11-22 DIAGNOSIS — K219 Gastro-esophageal reflux disease without esophagitis: Secondary | ICD-10-CM | POA: Insufficient documentation

## 2013-11-22 DIAGNOSIS — Z87891 Personal history of nicotine dependence: Secondary | ICD-10-CM | POA: Insufficient documentation

## 2013-11-22 LAB — CBC WITH DIFFERENTIAL/PLATELET
BASOS PCT: 1 % (ref 0–1)
Basophils Absolute: 0.1 10*3/uL (ref 0.0–0.1)
EOS ABS: 0.3 10*3/uL (ref 0.0–0.7)
Eosinophils Relative: 3 % (ref 0–5)
HCT: 36.4 % (ref 36.0–46.0)
HEMOGLOBIN: 12.2 g/dL (ref 12.0–15.0)
Lymphocytes Relative: 31 % (ref 12–46)
Lymphs Abs: 2.5 10*3/uL (ref 0.7–4.0)
MCH: 30 pg (ref 26.0–34.0)
MCHC: 33.5 g/dL (ref 30.0–36.0)
MCV: 89.4 fL (ref 78.0–100.0)
MONOS PCT: 7 % (ref 3–12)
Monocytes Absolute: 0.6 10*3/uL (ref 0.1–1.0)
Neutro Abs: 4.6 10*3/uL (ref 1.7–7.7)
Neutrophils Relative %: 58 % (ref 43–77)
PLATELETS: 287 10*3/uL (ref 150–400)
RBC: 4.07 MIL/uL (ref 3.87–5.11)
RDW: 13.5 % (ref 11.5–15.5)
WBC: 8.1 10*3/uL (ref 4.0–10.5)

## 2013-11-22 LAB — COMPREHENSIVE METABOLIC PANEL
ALBUMIN: 3.8 g/dL (ref 3.5–5.2)
ALT: 16 U/L (ref 0–35)
AST: 17 U/L (ref 0–37)
Alkaline Phosphatase: 70 U/L (ref 39–117)
BUN: 11 mg/dL (ref 6–23)
CALCIUM: 9.2 mg/dL (ref 8.4–10.5)
CO2: 25 mEq/L (ref 19–32)
Chloride: 105 mEq/L (ref 96–112)
Creatinine, Ser: 0.93 mg/dL (ref 0.50–1.10)
GFR calc non Af Amer: 78 mL/min — ABNORMAL LOW (ref >90)
GLUCOSE: 100 mg/dL — AB (ref 70–99)
Potassium: 4 mEq/L (ref 3.7–5.3)
Sodium: 142 mEq/L (ref 137–147)
TOTAL PROTEIN: 7.7 g/dL (ref 6.0–8.3)
Total Bilirubin: 0.2 mg/dL — ABNORMAL LOW (ref 0.3–1.2)

## 2013-11-22 LAB — URINALYSIS, ROUTINE W REFLEX MICROSCOPIC
Bilirubin Urine: NEGATIVE
Glucose, UA: NEGATIVE mg/dL
Hgb urine dipstick: NEGATIVE
Ketones, ur: NEGATIVE mg/dL
LEUKOCYTES UA: NEGATIVE
NITRITE: NEGATIVE
Protein, ur: NEGATIVE mg/dL
Specific Gravity, Urine: 1.01 (ref 1.005–1.030)
UROBILINOGEN UA: 0.2 mg/dL (ref 0.0–1.0)
pH: 6 (ref 5.0–8.0)

## 2013-11-22 LAB — LIPASE, BLOOD: Lipase: 50 U/L (ref 11–59)

## 2013-11-22 LAB — PREGNANCY, URINE: Preg Test, Ur: NEGATIVE

## 2013-11-22 MED ORDER — HYDROMORPHONE HCL PF 1 MG/ML IJ SOLN
1.0000 mg | Freq: Once | INTRAMUSCULAR | Status: AC
  Administered 2013-11-22: 1 mg via INTRAVENOUS
  Filled 2013-11-22: qty 1

## 2013-11-22 MED ORDER — HYDROCODONE-ACETAMINOPHEN 5-325 MG PO TABS: 2.0000 | ORAL_TABLET | ORAL | Status: DC | PRN

## 2013-11-22 MED ORDER — ONDANSETRON HCL 4 MG/2ML IJ SOLN
4.0000 mg | Freq: Once | INTRAMUSCULAR | Status: AC
  Administered 2013-11-22: 4 mg via INTRAVENOUS
  Filled 2013-11-22: qty 2

## 2013-11-22 MED ORDER — OMEPRAZOLE 20 MG PO CPDR: 20.0000 mg | DELAYED_RELEASE_CAPSULE | Freq: Every day | ORAL | Status: DC

## 2013-11-22 MED ORDER — GI COCKTAIL ~~LOC~~
30.0000 mL | Freq: Once | ORAL | Status: AC
  Administered 2013-11-22: 30 mL via ORAL
  Filled 2013-11-22: qty 30

## 2013-11-22 MED ORDER — SODIUM CHLORIDE 0.9 % IV BOLUS (SEPSIS)
1000.0000 mL | Freq: Once | INTRAVENOUS | Status: AC
  Administered 2013-11-22: 1000 mL via INTRAVENOUS

## 2013-11-22 MED ORDER — IOHEXOL 300 MG/ML  SOLN
50.0000 mL | Freq: Once | INTRAMUSCULAR | Status: AC | PRN
  Administered 2013-11-22: 50 mL via ORAL

## 2013-11-22 MED ORDER — IOHEXOL 300 MG/ML  SOLN
100.0000 mL | Freq: Once | INTRAMUSCULAR | Status: AC | PRN
  Administered 2013-11-22: 100 mL via INTRAVENOUS

## 2013-11-22 MED ORDER — ONDANSETRON HCL 4 MG PO TABS: 4.0000 mg | ORAL_TABLET | Freq: Four times a day (QID) | ORAL | Status: DC

## 2013-11-22 NOTE — ED Provider Notes (Signed)
CSN: [id number]     Arrival date & time 11/22/13  1910 History   First MD Initiated Contact with Patient 11/22/13 1919     Chief Complaint  Patient presents with  . Abdominal Pain     (Consider location/radiation/quality/duration/timing/severity/associated sxs/prior Treatment) HPI Comments: Patient presents with epigastric pain has been constant since 4:30 PM. She's had this pain intermittently for the past 3 weeks it comes on after eating. He normally goes away after one hour but is not going away today. Associated with nausea but no vomiting. No fever. Denies any urinary or vaginal symptoms. She had a hysterectomy performed 2 months ago which has been healing well. She still has her gallbladder. She endorses a history of acid reflux disease but is uncertain if this feels similar. She denies any chest pain or shortness of breath. Does hurt her epigastric take a deep breath. Pushing on her right upper quadrant makes epigastric pain better.  The history is provided by the patient.    Past Medical History  Diagnosis Date  . Hypothyroidism   . GERD (gastroesophageal reflux disease)    Past Surgical History  Procedure Laterality Date  . Bladder surgery      reports had bladder stretched as child  . Wisdom tooth extraction    . Abdominal hysterectomy N/A 10/06/2013    Procedure: HYSTERECTOMY ABDOMINAL;  Surgeon: Florian Buff, MD;  Location: [facility];  Service: Gynecology;  Laterality: N/A;  . Bilateral salpingectomy Bilateral 10/06/2013    Procedure: BILATERAL SALPINGECTOMY;  Surgeon: Florian Buff, MD;  Location: [facility];  Service: Gynecology;  Laterality: Bilateral;   Family History  Problem Relation Age of Onset  . Heart disease Father   . Hypertension Father   . Cancer Maternal Aunt   . Thyroid disease Maternal Aunt   . Cancer Paternal Aunt   . Thyroid disease Maternal Grandmother   . Cancer Maternal Grandmother   . Diabetes Maternal Grandfather   . Hypertension Maternal  Grandfather    History  Substance Use Topics  . Smoking status: Former Smoker -- 0.50 packs/day for 19 years    Types: Cigarettes    Quit date: 10/08/2011  . Smokeless tobacco: Never Used  . Alcohol Use: 0.0 oz/week     Comment: very rarley   OB History   Grav Para Term Preterm Abortions TAB SAB Ect Mult Living   1 0 0 0 1 0 1 0 0 0      Review of Systems  Constitutional: Negative for fever, activity change and appetite change.  HENT: Negative for congestion and rhinorrhea.   Respiratory: Negative for cough, chest tightness and shortness of breath.   Cardiovascular: Negative for chest pain.  Gastrointestinal: Positive for nausea and abdominal pain. Negative for vomiting.  Genitourinary: Negative for dysuria, hematuria, vaginal bleeding and vaginal discharge.  Musculoskeletal: Negative for arthralgias and back pain.  Skin: Negative for rash.  Neurological: Negative for dizziness, weakness and light-headedness.  A complete 10 system review of systems was obtained and all systems are negative except as noted in the HPI and PMH.      Allergies  Morphine and related and Sulfa antibiotics  Home Medications   Prior to Admission medications   Medication Sig Start Date End Date Taking? Authorizing Provider  acetaminophen (TYLENOL) 500 MG tablet Take 500 mg by mouth every 6 (six) hours as needed.   Yes Historical Provider, MD  esomeprazole (NEXIUM) 20 MG capsule Take 20 mg by mouth every morning.   Yes  Historical Provider, MD  levothyroxine (SYNTHROID, LEVOTHROID) 112 MCG tablet Take 112 mcg by mouth daily before breakfast.   Yes Historical Provider, MD  triamcinolone (NASACORT ALLERGY 24HR) 55 MCG/ACT AERO nasal inhaler Place 2 sprays into the nose daily.   Yes Historical Provider, MD  HYDROcodone-acetaminophen (NORCO/VICODIN) 5-325 MG per tablet Take 2 tablets by mouth every 4 (four) hours as needed. 11/22/13   Ezequiel Essex, MD  omeprazole (PRILOSEC) 20 MG capsule Take 1 capsule  (20 mg total) by mouth daily. 11/22/13   Ezequiel Essex, MD  ondansetron (ZOFRAN) 4 MG tablet Take 1 tablet (4 mg total) by mouth every 6 (six) hours. 11/22/13   Ezequiel Essex, MD   BP 152/101  Pulse 100  Temp(Src) 98.3 F (36.8 C) (Oral)  Resp 20  Ht 5\' 9"  (1.753 m)  Wt 228 lb (103.42 kg)  BMI 33.65 kg/m2  SpO2 96%  LMP 09/18/2013 Physical Exam  Constitutional: She is oriented to person, place, and time. She appears well-developed and well-nourished. No distress.  HENT:  Head: Normocephalic and atraumatic.  Mouth/Throat: Oropharynx is clear and moist. No oropharyngeal exudate.  Eyes: Conjunctivae and EOM are normal. Pupils are equal, round, and reactive to light.  Neck: Normal range of motion. Neck supple.  Cardiovascular: Normal rate and normal heart sounds.   Pulmonary/Chest: Effort normal and breath sounds normal. No respiratory distress.  Abdominal: Soft. There is tenderness. There is no rebound and no guarding.  Tender in the epigastrium without guarding or rebound. Minimal right upper quadrant tenderness. Lower abdomen is soft and nontender.  Musculoskeletal: Normal range of motion. She exhibits no edema and no tenderness.  Neurological: She is alert and oriented to person, place, and time. No cranial nerve deficit. She exhibits normal muscle tone. Coordination normal.  Skin: Skin is warm.    ED Course  Procedures (including critical care time) Labs Review Labs Reviewed  COMPREHENSIVE METABOLIC PANEL - Abnormal; Notable for the following:    Glucose, Bld 100 (*)    Total Bilirubin 0.2 (*)    GFR calc non Af Amer 78 (*)    All other components within normal limits  CBC WITH DIFFERENTIAL  LIPASE, BLOOD  URINALYSIS, ROUTINE W REFLEX MICROSCOPIC  PREGNANCY, URINE    Imaging Review Ct Abdomen Pelvis W Contrast  11/22/2013   CLINICAL DATA:  Epigastric abdominal pain and heartburn.  EXAM: CT ABDOMEN AND PELVIS WITH CONTRAST  TECHNIQUE: Multidetector CT imaging of the  abdomen and pelvis was performed using the standard protocol following bolus administration of intravenous contrast.  CONTRAST:  46mL OMNIPAQUE IOHEXOL 300 MG/ML SOLN, 177mL OMNIPAQUE IOHEXOL 300 MG/ML SOLN  COMPARISON:  Pelvic ultrasound performed 03/12/2012  FINDINGS: The visualized lung bases are clear. There is no evidence of a hiatal hernia.  The liver and spleen are unremarkable in appearance. A stone is noted at the fundus of the gallbladder; the gallbladder is otherwise unremarkable in appearance. The pancreas and adrenal glands are unremarkable.  The kidneys are unremarkable in appearance. There is no evidence of hydronephrosis. No renal or ureteral stones are seen. No perinephric stranding is appreciated.  No free fluid is identified. The small bowel is unremarkable in appearance. The stomach is within normal limits. No acute vascular abnormalities are seen.  The appendix is normal in caliber, without evidence for appendicitis. The colon is largely decompressed and is unremarkable in appearance.  The bladder is mildly distended and grossly unremarkable. The patient is status post hysterectomy. There is mild soft tissue inflammation about  the vaginal cuff and ovaries, of uncertain significance. This may be postoperative in nature. No suspicious adnexal masses are seen. Mild postoperative change is noted along the anterior pelvic wall. No inguinal lymphadenopathy is seen.  No acute osseous abnormalities are identified.  IMPRESSION: 1. No acute abnormality seen to explain the patient's symptoms. 2. Cholelithiasis noted; gallbladder otherwise unremarkable in appearance. 3. Mild soft tissue inflammation about the vaginal cuff and ovaries, of uncertain significance. This may simply be postoperative in nature. Would correlate for associated symptoms. No suspicious adnexal masses seen.   Electronically Signed   By: Garald Balding M.D.   On: 11/22/2013 21:58     EKG Interpretation None      MDM   Final  diagnoses:  Epigastric pain   Epigastric abdominal pain with nausea intermittent for the past 3 weeks, worse today and constant.  LFTs and lipase normal. White count normal. Urinalysis negative.  CT scan shows no acute abnormality. Gallstones seen without evidence of cholecystitis. EKG nsr without ST changes.  Pain is improved with GI cocktail and antiemetics in the ED. We'll have patient return for gallbladder ultrasound tomorrow. However suspect the patient's pain more so do to gastritis or esophagitis. We'll start PPI and follow up with GI. Return precautions discussed.  BP 152/101  Pulse 100  Temp(Src) 98.3 F (36.8 C) (Oral)  Resp 20  Ht 5\' 9"  (1.753 m)  Wt 228 lb (103.42 kg)  BMI 33.65 kg/m2  SpO2 96%  LMP 09/18/2013   Ezequiel Essex, MD 11/22/13 2314

## 2013-11-22 NOTE — ED Notes (Signed)
Pt tolerated one cup of fluids w/o difficulty.

## 2013-11-22 NOTE — Discharge Instructions (Signed)
Abdominal Pain, Adult Take the stomach medication as prescribed. Return tomorrow for an ultrasound of your gallbladder. Followup with your doctor. Return to the ED if you develop new or worsening symptoms. Many things can cause abdominal pain. Usually, abdominal pain is not caused by a disease and will improve without treatment. It can often be observed and treated at home. Your health care provider will do a physical exam and possibly order blood tests and X-rays to help determine the seriousness of your pain. However, in many cases, more time must pass before a clear cause of the pain can be found. Before that point, your health care provider may not know if you need more testing or further treatment. HOME CARE INSTRUCTIONS  Monitor your abdominal pain for any changes. The following actions may help to alleviate any discomfort you are experiencing:  Only take over-the-counter or prescription medicines as directed by your health care provider.  Do not take laxatives unless directed to do so by your health care provider.  Try a clear liquid diet (broth, tea, or water) as directed by your health care provider. Slowly move to a bland diet as tolerated. SEEK MEDICAL CARE IF:  You have unexplained abdominal pain.  You have abdominal pain associated with nausea or diarrhea.  You have pain when you urinate or have a bowel movement.  You experience abdominal pain that wakes you in the night.  You have abdominal pain that is worsened or improved by eating food.  You have abdominal pain that is worsened with eating fatty foods. SEEK IMMEDIATE MEDICAL CARE IF:   Your pain does not go away within 2 hours.  You have a fever.  You keep throwing up (vomiting).  Your pain is felt only in portions of the abdomen, such as the right side or the left lower portion of the abdomen.  You pass bloody or black tarry stools. MAKE SURE YOU:  Understand these instructions.   Will watch your condition.    Will get help right away if you are not doing well or get worse.  Document Released: 03/06/2005 Document Revised: 03/17/2013 Document Reviewed: 02/03/2013 [facility] Patient Information 2014 [place].

## 2013-11-22 NOTE — ED Notes (Signed)
Patient c/o RUQ pain since 1630.  Patient denies vomiting, but states has nausea and heartburn.  Patient holding area under right breast.

## 2013-11-23 ENCOUNTER — Other Ambulatory Visit (HOSPITAL_COMMUNITY): Payer: Self-pay | Admitting: Emergency Medicine

## 2013-11-23 ENCOUNTER — Ambulatory Visit (HOSPITAL_COMMUNITY)
Admit: 2013-11-23 | Discharge: 2013-11-23 | Disposition: A | Discharge status: Home / Self Care | Payer: Managed Care, Other (non HMO) | Source: Ambulatory Visit | Attending: Emergency Medicine | Admitting: Emergency Medicine

## 2013-11-23 DIAGNOSIS — R1011 Right upper quadrant pain: Secondary | ICD-10-CM

## 2013-11-23 DIAGNOSIS — K8020 Calculus of gallbladder without cholecystitis without obstruction: Secondary | ICD-10-CM | POA: Insufficient documentation

## 2013-11-23 NOTE — ED Provider Notes (Signed)
Patient returned for gallbladder ultrasound today. Results below. LFTs, lipase and white count normal. Results discussed with Dr. Arnoldo Morale who agrees she should follow up for an outpatient appointment. He is going out of town this weeks if she gets worse she may need to see Dr. Romona Curls.  US Abdomen Limited Ruq  11/23/2013   CLINICAL DATA:  Known cholelithiasis and right upper quadrant pain  EXAM: US ABDOMEN LIMITED - RIGHT UPPER QUADRANT  COMPARISON:  None.  FINDINGS: Gallbladder:  Partially decompressed accentuating wall thickness. Mild edema is noted within the wall. No pericholecystic fluid is seen. A single stone and gallbladder sludge is identified similar to that seen on recent CT.  Common bile duct:  Diameter: 4.5 mm.  Liver:  No focal lesion identified. Within normal limits in parenchymal echogenicity.  IMPRESSION: Mild gallbladder wall thickening as well as gallstone and gallbladder sludge. A portion of the gallbladder wall thickening may be related to partial decompression although edema is seen.   Electronically Signed   By: Inez Catalina M.D.   On: 11/23/2013 13:36     Ezequiel Essex, MD 11/23/13 1715

## 2013-11-24 ENCOUNTER — Encounter (HOSPITAL_COMMUNITY): Payer: Self-pay | Admitting: Pharmacy Technician

## 2013-11-25 ENCOUNTER — Encounter (HOSPITAL_COMMUNITY): Payer: Self-pay

## 2013-11-25 ENCOUNTER — Encounter (HOSPITAL_COMMUNITY)
Admission: RE | Admit: 2013-11-25 | Discharge: 2013-11-25 | Disposition: A | Discharge status: Home / Self Care | Payer: Managed Care, Other (non HMO) | Source: Ambulatory Visit | Attending: General Surgery | Admitting: General Surgery

## 2013-11-25 NOTE — Consult Note (Signed)
NAMEJENESYS, CASSEUS NO.:  [id number]  MEDICAL RECORD NO.:  [id number]  LOCATION:                                 FACILITY:  PHYSICIAN:  Felicie Morn, M.D. DATE OF BIRTH:  December 31, 1975  DATE OF CONSULTATION:  11/24/2013 DATE OF DISCHARGE:                                CONSULTATION   NOTE:  This is a 38 year old white female who was referred from the emergency room after being evaluated for nausea and right upper quadrant pain.  She had a CT scan, which showed cholelithiasis.  She was referred to me for followup.  She had an outpatient sonogram which also revealed cholelithiasis and some mild thickening noted on the gallbladder.  Liver function studies otherwise were within normal limits.  She has not had any fever or chills,  however, she has had some postprandial nausea with pain radiating to her back.  PAST MEDICAL HISTORY:  Hypothyroidism.  She has problems with reflux, and she as a child at 36 years old had a bout of scarlet fever.  PAST SURGICAL HISTORY:  Bladder surgery.  She had a total abdominal hysterectomy done by Dr.Eure in April of this year, 2015.  She also had her wisdom teeth extracted.  MEDICATIONS:  See medication sheet.  She has a history of morphine causing her some intolerance with a headache and sulfa drugs she had noticed a yeast infection afterwards.  I doubt that these are actual allergic reactions, however.  She is a nonsmoker and nondrinker.  PHYSICAL EXAMINATION:  VITAL SIGNS:  She is 5 feet 9 inches, weighs 230 pounds, temperature is 98.2, pulse was 80 and regular, respirations 12, blood pressure 130/70. HEENT:  Head is normocephalic.  Eyes, extraocular movements are intact. Pupils are round and reactive to light and accommodation.  There is no adenopathy, no bruits are appreciated.  No jugular vein distention, and no thyromegaly. CHEST:  Clear both anterior and posterior auscultation. HEART:  Regular rhythm. BREASTS:   Breasts and axillae are without masses. ABDOMEN.  The patient is tender over right upper quadrant with guarding noted.  No visceromegaly is appreciated.  No femoral or inguinal hernias are appreciated. RECTAL:  Deferred.  The patient refused exam. EXTREMITIES:  No clubbing, varicosities or cyanosis are appreciated.  REVIEW OF SYSTEMS:  NEURO:  No history of migraines or seizures or lateralizing neurological findings.  ENDOCRINE:  No history of diabetes or adrenal problems.  She does have a history of hypothyroidism. CARDIOPULMONARY:  Grossly within normal limits for age. MUSCULOSKELETAL: The patient is overweight.  OB-GYN: She is a gravida 1, para 0, cesarean 0, abortus 1 female whose last menstrual period was in April 2015 prior to having a total abdominal hysterectomy.  No family history of carcinoma of the breast.  Her last mammogram was in January 2015.  GI:  History of reflux.  No history of hepatitis.  She has some recurrent constipation.  No bright red rectal bleeding, black tarry stools, or diarrhea.  No history of inflammatory bowel disease or irritable bowel syndrome.  No unexplained weight loss. The patient has never had a colonoscopy.  GU:  No history of frequency, dysuria, or kidney  stones.  REVIEW OF HISTORY AND PHYSICAL:  Therefore, Ms. Melendrez is a 38 year old white female who has cholecystitis secondary to cholelithiasis.  I wanted to admit her, however, because of family concerns she wanted to postpone her surgery but finally able to get her to have her surgery this week.  She has been placed on a very restrictive diet and told to come to the emergency room or call me should there be any acute exacerbations, and we will plan to operate on her via the outpatient department.  I discussed surgery including complications not limited to but including bleeding, infection, damage to bile ducts, perforation of organs, transitory diarrhea, and the possibility of open surgery  might be required.  Informed consent was obtained.     Felicie Morn, M.D.     WB/MEDQ  D:  11/24/2013  T:  11/24/2013  Job:  [id number]

## 2013-11-26 ENCOUNTER — Ambulatory Visit (HOSPITAL_COMMUNITY): Payer: Managed Care, Other (non HMO) | Admitting: Anesthesiology

## 2013-11-26 ENCOUNTER — Encounter (HOSPITAL_COMMUNITY)
Admission: RE | Disposition: A | Discharge status: Home / Self Care | Payer: Self-pay | Source: Ambulatory Visit | Attending: General Surgery

## 2013-11-26 ENCOUNTER — Encounter (HOSPITAL_COMMUNITY): Payer: Managed Care, Other (non HMO) | Admitting: Anesthesiology

## 2013-11-26 ENCOUNTER — Ambulatory Visit (HOSPITAL_COMMUNITY)
Admission: RE | Admit: 2013-11-26 | Discharge: 2013-11-27 | Disposition: A | Discharge status: Home / Self Care | Payer: Managed Care, Other (non HMO) | Source: Ambulatory Visit | Attending: General Surgery | Admitting: General Surgery

## 2013-11-26 ENCOUNTER — Encounter (HOSPITAL_COMMUNITY): Payer: Self-pay | Admitting: *Deleted

## 2013-11-26 DIAGNOSIS — N760 Acute vaginitis: Secondary | ICD-10-CM

## 2013-11-26 DIAGNOSIS — E669 Obesity, unspecified: Secondary | ICD-10-CM | POA: Insufficient documentation

## 2013-11-26 DIAGNOSIS — K219 Gastro-esophageal reflux disease without esophagitis: Secondary | ICD-10-CM | POA: Insufficient documentation

## 2013-11-26 DIAGNOSIS — E039 Hypothyroidism, unspecified: Secondary | ICD-10-CM | POA: Insufficient documentation

## 2013-11-26 DIAGNOSIS — K8010 Calculus of gallbladder with chronic cholecystitis without obstruction: Secondary | ICD-10-CM | POA: Insufficient documentation

## 2013-11-26 DIAGNOSIS — Z6833 Body mass index (BMI) 33.0-33.9, adult: Secondary | ICD-10-CM | POA: Insufficient documentation

## 2013-11-26 DIAGNOSIS — K8000 Calculus of gallbladder with acute cholecystitis without obstruction: Secondary | ICD-10-CM | POA: Diagnosis present

## 2013-11-26 DIAGNOSIS — Z90710 Acquired absence of both cervix and uterus: Secondary | ICD-10-CM | POA: Insufficient documentation

## 2013-11-26 DIAGNOSIS — N920 Excessive and frequent menstruation with regular cycle: Secondary | ICD-10-CM

## 2013-11-26 DIAGNOSIS — Z87891 Personal history of nicotine dependence: Secondary | ICD-10-CM | POA: Insufficient documentation

## 2013-11-26 DIAGNOSIS — IMO0002 Reserved for concepts with insufficient information to code with codable children: Secondary | ICD-10-CM

## 2013-11-26 HISTORY — PX: CHOLECYSTECTOMY: SHX55

## 2013-11-26 LAB — TSH: TSH: 6.53 u[IU]/mL — AB (ref 0.350–4.500)

## 2013-11-26 LAB — T3: T3, Total: 88 ng/dl (ref 80.0–204.0)

## 2013-11-26 LAB — T4: T4 TOTAL: 9.9 ug/dL (ref 5.0–12.5)

## 2013-11-26 SURGERY — LAPAROSCOPIC CHOLECYSTECTOMY
Anesthesia: General | Site: Abdomen

## 2013-11-26 MED ORDER — OXYCODONE-ACETAMINOPHEN 5-325 MG PO TABS
1.0000 | ORAL_TABLET | ORAL | Status: DC | PRN
  Administered 2013-11-26 – 2013-11-27 (×6): 1 via ORAL
  Filled 2013-11-26 (×6): qty 1

## 2013-11-26 MED ORDER — FENTANYL CITRATE 0.05 MG/ML IJ SOLN
INTRAMUSCULAR | Status: AC
  Filled 2013-11-26: qty 2

## 2013-11-26 MED ORDER — SUCCINYLCHOLINE CHLORIDE 20 MG/ML IJ SOLN
INTRAMUSCULAR | Status: AC
  Filled 2013-11-26: qty 1

## 2013-11-26 MED ORDER — LIDOCAINE HCL 1 % IJ SOLN
INTRAMUSCULAR | Status: DC | PRN
  Administered 2013-11-26: 40 mg via INTRADERMAL

## 2013-11-26 MED ORDER — ONDANSETRON HCL 4 MG/2ML IJ SOLN
4.0000 mg | Freq: Once | INTRAMUSCULAR | Status: AC
  Administered 2013-11-26: 4 mg via INTRAVENOUS

## 2013-11-26 MED ORDER — BUPIVACAINE HCL (PF) 0.5 % IJ SOLN
INTRAMUSCULAR | Status: DC | PRN
  Administered 2013-11-26: 9 mL

## 2013-11-26 MED ORDER — GLYCOPYRROLATE 0.2 MG/ML IJ SOLN
0.2000 mg | Freq: Once | INTRAMUSCULAR | Status: AC
  Administered 2013-11-26: 0.2 mg via INTRAVENOUS
  Filled 2013-11-26: qty 1

## 2013-11-26 MED ORDER — ONDANSETRON HCL 4 MG/2ML IJ SOLN: 4.0000 mg | Freq: Once | INTRAMUSCULAR | Status: DC | PRN

## 2013-11-26 MED ORDER — MIDAZOLAM HCL 2 MG/2ML IJ SOLN
1.0000 mg | INTRAMUSCULAR | Status: DC | PRN
  Administered 2013-11-26: 2 mg via INTRAVENOUS

## 2013-11-26 MED ORDER — NEOSTIGMINE METHYLSULFATE 10 MG/10ML IV SOLN
INTRAVENOUS | Status: DC | PRN
  Administered 2013-11-26: 3 mg via INTRAVENOUS

## 2013-11-26 MED ORDER — FENTANYL CITRATE 0.05 MG/ML IJ SOLN
INTRAMUSCULAR | Status: DC | PRN
  Administered 2013-11-26 (×8): 50 ug via INTRAVENOUS

## 2013-11-26 MED ORDER — LIDOCAINE HCL (PF) 1 % IJ SOLN
INTRAMUSCULAR | Status: AC
  Filled 2013-11-26: qty 5

## 2013-11-26 MED ORDER — ONDANSETRON HCL 4 MG/2ML IJ SOLN
INTRAMUSCULAR | Status: AC
  Filled 2013-11-26: qty 2

## 2013-11-26 MED ORDER — LEVOTHYROXINE SODIUM 112 MCG PO TABS
112.0000 ug | ORAL_TABLET | Freq: Every day | ORAL | Status: DC
  Administered 2013-11-27: 112 ug via ORAL
  Filled 2013-11-26 (×3): qty 1

## 2013-11-26 MED ORDER — PROPOFOL 10 MG/ML IV BOLUS
INTRAVENOUS | Status: DC | PRN
  Administered 2013-11-26: 150 mg via INTRAVENOUS

## 2013-11-26 MED ORDER — MIDAZOLAM HCL 2 MG/2ML IJ SOLN
INTRAMUSCULAR | Status: AC
  Filled 2013-11-26: qty 2

## 2013-11-26 MED ORDER — ACETAMINOPHEN 500 MG PO TABS: 500.0000 mg | ORAL_TABLET | Freq: Four times a day (QID) | ORAL | Status: DC | PRN

## 2013-11-26 MED ORDER — POTASSIUM CHLORIDE IN NACL 20-0.9 MEQ/L-% IV SOLN
INTRAVENOUS | Status: DC
  Administered 2013-11-26: 100 mL/h via INTRAVENOUS
  Administered 2013-11-26: 22:00:00 via INTRAVENOUS
  Administered 2013-11-27: 100 mL/h via INTRAVENOUS

## 2013-11-26 MED ORDER — DEXTROSE 5 % IV SOLN
1.0000 g | Freq: Once | INTRAVENOUS | Status: AC
  Administered 2013-11-26: 1 g via INTRAVENOUS
  Filled 2013-11-26: qty 10

## 2013-11-26 MED ORDER — WATER FOR IRRIGATION, STERILE IR SOLN
Status: DC | PRN
  Administered 2013-11-26: 2000 mL

## 2013-11-26 MED ORDER — LACTATED RINGERS IV SOLN
INTRAVENOUS | Status: DC
  Administered 2013-11-26: 1000 mL via INTRAVENOUS

## 2013-11-26 MED ORDER — NEOSTIGMINE METHYLSULFATE 10 MG/10ML IV SOLN
INTRAVENOUS | Status: AC
  Filled 2013-11-26: qty 1

## 2013-11-26 MED ORDER — PROPOFOL 10 MG/ML IV EMUL
INTRAVENOUS | Status: AC
  Filled 2013-11-26: qty 20

## 2013-11-26 MED ORDER — FENTANYL CITRATE 0.05 MG/ML IJ SOLN
INTRAMUSCULAR | Status: AC
  Filled 2013-11-26: qty 5

## 2013-11-26 MED ORDER — DOCUSATE SODIUM 100 MG PO CAPS
100.0000 mg | ORAL_CAPSULE | Freq: Every day | ORAL | Status: DC
  Administered 2013-11-26 – 2013-11-27 (×2): 100 mg via ORAL
  Filled 2013-11-26 (×2): qty 1

## 2013-11-26 MED ORDER — ROCURONIUM BROMIDE 50 MG/5ML IV SOLN
INTRAVENOUS | Status: AC
  Filled 2013-11-26: qty 1

## 2013-11-26 MED ORDER — BUPIVACAINE HCL (PF) 0.5 % IJ SOLN
INTRAMUSCULAR | Status: AC
  Filled 2013-11-26: qty 30

## 2013-11-26 MED ORDER — FENTANYL CITRATE 0.05 MG/ML IJ SOLN
25.0000 ug | INTRAMUSCULAR | Status: DC | PRN
  Administered 2013-11-26 (×4): 50 ug via INTRAVENOUS
  Filled 2013-11-26 (×2): qty 2

## 2013-11-26 MED ORDER — ONDANSETRON HCL 4 MG PO TABS
4.0000 mg | ORAL_TABLET | Freq: Four times a day (QID) | ORAL | Status: DC
  Administered 2013-11-26 – 2013-11-27 (×4): 4 mg via ORAL
  Filled 2013-11-26 (×4): qty 1

## 2013-11-26 MED ORDER — SUCCINYLCHOLINE CHLORIDE 20 MG/ML IJ SOLN
INTRAMUSCULAR | Status: DC | PRN
  Administered 2013-11-26: 120 mg via INTRAVENOUS

## 2013-11-26 MED ORDER — HYDROMORPHONE HCL PF 1 MG/ML IJ SOLN
0.5000 mg | INTRAMUSCULAR | Status: DC | PRN
Start: 2013-11-26 — End: 2013-11-27
  Administered 2013-11-26 – 2013-11-27 (×4): 0.5 mg via INTRAVENOUS
  Filled 2013-11-26 (×4): qty 1

## 2013-11-26 MED ORDER — ONDANSETRON HCL 4 MG/2ML IJ SOLN: 4.0000 mg | Freq: Four times a day (QID) | INTRAMUSCULAR | Status: DC | PRN

## 2013-11-26 MED ORDER — ONDANSETRON HCL 4 MG PO TABS
4.0000 mg | ORAL_TABLET | Freq: Four times a day (QID) | ORAL | Status: DC | PRN
  Administered 2013-11-26: 4 mg via ORAL

## 2013-11-26 MED ORDER — DEXAMETHASONE SODIUM PHOSPHATE 4 MG/ML IJ SOLN
4.0000 mg | Freq: Once | INTRAMUSCULAR | Status: AC
  Administered 2013-11-26: 4 mg via INTRAVENOUS
  Filled 2013-11-26: qty 1

## 2013-11-26 MED ORDER — ROCURONIUM BROMIDE 100 MG/10ML IV SOLN
INTRAVENOUS | Status: DC | PRN
  Administered 2013-11-26: 25 mg via INTRAVENOUS
  Administered 2013-11-26: 5 mg via INTRAVENOUS

## 2013-11-26 MED ORDER — SODIUM CHLORIDE 0.9 % IR SOLN
Status: DC | PRN
  Administered 2013-11-26: 3000 mL

## 2013-11-26 MED ORDER — PANTOPRAZOLE SODIUM 40 MG PO TBEC
40.0000 mg | DELAYED_RELEASE_TABLET | Freq: Every day | ORAL | Status: DC
  Administered 2013-11-26 – 2013-11-27 (×2): 40 mg via ORAL
  Filled 2013-11-26 (×2): qty 1

## 2013-11-26 MED ORDER — SODIUM CHLORIDE 0.9 % IR SOLN
Status: DC | PRN
  Administered 2013-11-26: 1000 mL

## 2013-11-26 MED ORDER — GLYCOPYRROLATE 0.2 MG/ML IJ SOLN
INTRAMUSCULAR | Status: DC | PRN
  Administered 2013-11-26: .5 mg via INTRAVENOUS

## 2013-11-26 MED ORDER — GLYCOPYRROLATE 0.2 MG/ML IJ SOLN
INTRAMUSCULAR | Status: AC
  Filled 2013-11-26: qty 2

## 2013-11-26 MED ORDER — HEMOSTATIC AGENTS (NO CHARGE) OPTIME
TOPICAL | Status: DC | PRN
  Administered 2013-11-26: 1 via TOPICAL

## 2013-11-26 SURGICAL SUPPLY — 52 items
APPLICATOR COTTON TIP 6IN STRL (MISCELLANEOUS) ×3 IMPLANT
APPLIER CLIP LAPSCP 10X32 DD (CLIP) ×3 IMPLANT
BAG HAMPER (MISCELLANEOUS) ×3 IMPLANT
CLOTH BEACON ORANGE TIMEOUT ST (SAFETY) ×3 IMPLANT
COVER LIGHT HANDLE STERIS (MISCELLANEOUS) ×6 IMPLANT
DECANTER SPIKE VIAL GLASS SM (MISCELLANEOUS) ×3 IMPLANT
DISSECTOR BLUNT TIP ENDO 5MM (MISCELLANEOUS) ×3 IMPLANT
DRSG TEGADERM 2-3/8X2-3/4 SM (GAUZE/BANDAGES/DRESSINGS) ×12 IMPLANT
ELECT REM PT RETURN 9FT ADLT (ELECTROSURGICAL) ×3
ELECTRODE REM PT RTRN 9FT ADLT (ELECTROSURGICAL) ×1 IMPLANT
EVACUATOR DRAINAGE 10X20 100CC (DRAIN) ×1 IMPLANT
EVACUATOR SILICONE 100CC (DRAIN) ×2
FILTER SMOKE EVAC LAPAROSHD (FILTER) ×3 IMPLANT
FORMALIN 10 PREFIL 120ML (MISCELLANEOUS) ×3 IMPLANT
GAUZE SPONGE 4X4 12PLY STRL (GAUZE/BANDAGES/DRESSINGS) ×3 IMPLANT
GLOVE BIO SURGEON STRL SZ8.5 (GLOVE) ×3 IMPLANT
GLOVE BIOGEL PI IND STRL 7.5 (GLOVE) ×1 IMPLANT
GLOVE BIOGEL PI IND STRL 8.5 (GLOVE) ×1 IMPLANT
GLOVE BIOGEL PI INDICATOR 7.5 (GLOVE) ×2
GLOVE BIOGEL PI INDICATOR 8.5 (GLOVE) ×2
GLOVE ECLIPSE 7.0 STRL STRAW (GLOVE) ×3 IMPLANT
GLOVE EXAM NITRILE PF LG BLUE (GLOVE) ×3 IMPLANT
GLOVE SKINSENSE NS SZ7.0 (GLOVE) ×2
GLOVE SKINSENSE STRL SZ7.0 (GLOVE) ×1 IMPLANT
GOWN STRL REUS W/TWL LRG LVL3 (GOWN DISPOSABLE) ×9 IMPLANT
HEMOSTAT SURGICEL 4X8 (HEMOSTASIS) ×3 IMPLANT
INST SET LAPROSCOPIC AP (KITS) ×3 IMPLANT
IV NS IRRIG 3000ML ARTHROMATIC (IV SOLUTION) ×3 IMPLANT
KIT ROOM TURNOVER APOR (KITS) ×3 IMPLANT
MANIFOLD NEPTUNE II (INSTRUMENTS) ×3 IMPLANT
NEEDLE INSUFFLATION 14GA 120MM (NEEDLE) ×3 IMPLANT
NS IRRIG 1000ML POUR BTL (IV SOLUTION) ×3 IMPLANT
PACK LAP CHOLE LZT030E (CUSTOM PROCEDURE TRAY) ×3 IMPLANT
PAD ARMBOARD 7.5X6 YLW CONV (MISCELLANEOUS) ×3 IMPLANT
POUCH SPECIMEN RETRIEVAL 10MM (ENDOMECHANICALS) ×3 IMPLANT
SET BASIN LINEN APH (SET/KITS/TRAYS/PACK) ×3 IMPLANT
SET TUBE IRRIG SUCTION NO TIP (IRRIGATION / IRRIGATOR) ×3 IMPLANT
SLEEVE ENDOPATH XCEL 5M (ENDOMECHANICALS) ×3 IMPLANT
SOL PREP PROV IODINE SCRUB 4OZ (MISCELLANEOUS) ×3 IMPLANT
SPONGE DRAIN TRACH 4X4 STRL 2S (GAUZE/BANDAGES/DRESSINGS) ×3 IMPLANT
SPONGE GAUZE 4X4 12PLY (GAUZE/BANDAGES/DRESSINGS) ×2 IMPLANT
STAPLER VISISTAT 35W (STAPLE) ×3 IMPLANT
SUT ETHILON 3 0 FSL (SUTURE) ×3 IMPLANT
SUT VICRYL 0 UR6 27IN ABS (SUTURE) ×6 IMPLANT
TOWEL OR 17X26 4PK STRL BLUE (TOWEL DISPOSABLE) ×3 IMPLANT
TRAY FOLEY CATH 16FR SILVER (SET/KITS/TRAYS/PACK) ×3 IMPLANT
TROCAR ENDO BLADELESS 11MM (ENDOMECHANICALS) ×3 IMPLANT
TROCAR XCEL NON-BLD 5MMX100MML (ENDOMECHANICALS) ×3 IMPLANT
TROCAR XCEL UNIV SLVE 11M 100M (ENDOMECHANICALS) ×3 IMPLANT
TUBING INSUFFLATION HIGH FLOW (TUBING) ×3 IMPLANT
WARMER LAPAROSCOPE (MISCELLANEOUS) ×3 IMPLANT
WATER STERILE IRR 1000ML POUR (IV SOLUTION) ×6 IMPLANT

## 2013-11-26 NOTE — Progress Notes (Signed)
Blood for t3 t4 and thyroid panel drawn and taken to lab.

## 2013-11-26 NOTE — Anesthesia Postprocedure Evaluation (Signed)
  Anesthesia Post-op Note  Patient: Catherine [name]  Procedure(s) Performed: Procedure(s): LAPAROSCOPIC CHOLECYSTECTOMY (N/A)  Patient Location: PACU  Anesthesia Type:General  Level of Consciousness: awake  Airway and Oxygen Therapy: Patient Spontanous Breathing and Patient connected to face mask oxygen  Post-op Pain: mild  Post-op Assessment: Post-op Vital signs reviewed, Patient's Cardiovascular Status Stable, Respiratory Function Stable, Patent Airway and No signs of Nausea or vomiting  Post-op Vital Signs: Reviewed and stable  Last Vitals:  Filed Vitals:   11/26/13 0715  BP: 112/76  Pulse:   Temp:   Resp: 21    Complications: No apparent anesthesia complications

## 2013-11-26 NOTE — Progress Notes (Signed)
Ost op check  Filed Vitals:   11/26/13 1504  BP: 126/93  Pulse: 61  Temp: 98.4 F (36.9 C)  Resp: 18    Dressings dry and in tact.  Minimal JP drainage tinged with Surgicel.  Pt voiding well and has pain under control.  Doing well post op.

## 2013-11-26 NOTE — Brief Op Note (Signed)
11/26/2013  8:35 AM  PATIENT:  Catherine [name]  38 y.o. female  PRE-OPERATIVE DIAGNOSIS:  cholelithiasis  POST-OPERATIVE DIAGNOSIS:  cholelithiasis, cholecystitis  PROCEDURE:  Procedure(s): LAPAROSCOPIC CHOLECYSTECTOMY (N/A)  SURGEON:  Surgeon(s) and Role:    * Scherry Ran, MD - Primary  PHYSICIAN ASSISTANT:   ASSISTANTS: none   ANESTHESIA:   general  EBL:  Total I/O In: 500 [I.V.:500] Out: 50 [Urine:50]  BLOOD ADMINISTERED:none  DRAINS: jackson-pratt drain in liver bed.  LOCAL MEDICATIONS USED:  MARCAINE 0.5% 10cc.    SPECIMEN:  Source of Specimen:  gallbladder and stones.  DISPOSITION OF SPECIMEN:  PATHOLOGY  COUNTS:  YES  TOURNIQUET:  * No tourniquets in log *  DICTATION: .Other Dictation: Dictation Number OR dict. #  [id number].  PLAN OF CARE: Admit for overnight observation  PATIENT DISPOSITION:  PACU - hemodynamically stable.   Delay start of Pharmacological VTE agent (>24hrs) due to surgical blood loss or risk of bleeding: not applicable

## 2013-11-26 NOTE — Progress Notes (Signed)
Admit via OP dept 24 yr. Old W.female for cholecystectomy for cholelithiasis.  Pt. Was seen arlier in ER and  Clinically pt feels better and abdomen is a lot less tender. later came to my office where surgery was advised.  Clinically pt feels a lot better and abdomen is much less tender.  Will proceed with surgery today.  Filed Vitals:   11/26/13 0645  BP: 127/84  Pulse:   Temp:   Resp: 33  pulse 78, temp 98.1, O2 sat 100%.

## 2013-11-26 NOTE — Anesthesia Procedure Notes (Signed)
Procedure Name: Intubation Date/Time: 11/26/2013 7:30 AM Performed by: Tressie Stalker E Pre-anesthesia Checklist: Patient identified, Patient being monitored, Timeout performed, Emergency Drugs available and Suction available Patient Re-evaluated:Patient Re-evaluated prior to inductionOxygen Delivery Method: Circle System Utilized Preoxygenation: Pre-oxygenation with 100% oxygen Intubation Type: IV induction Ventilation: Mask ventilation without difficulty Laryngoscope Size: Mac and 3 Grade View: Grade I Tube type: Oral Tube size: 7.0 mm Number of attempts: 1 Airway Equipment and Method: stylet Placement Confirmation: ETT inserted through vocal cords under direct vision,  positive ETCO2 and breath sounds checked- equal and bilateral Secured at: 21 cm Tube secured with: Tape Dental Injury: Teeth and Oropharynx as per pre-operative assessment

## 2013-11-26 NOTE — Progress Notes (Signed)
Advanced the patients diet to regular foods.  She tolerated her lunch and dinner well according to the patient.  I verbalized to her to have graham crackers and ice cream to ensure she can tolerate regular food.  She verbalized understanding.

## 2013-11-26 NOTE — Transfer of Care (Signed)
Immediate Anesthesia Transfer of Care Note  Patient: Catherine [name]  Procedure(s) Performed: Procedure(s): LAPAROSCOPIC CHOLECYSTECTOMY (N/A)  Patient Location: PACU  Anesthesia Type:General  Level of Consciousness: awake  Airway & Oxygen Therapy: Patient Spontanous Breathing and Patient connected to face mask oxygen  Post-op Assessment: Report given to PACU RN  Post vital signs: Reviewed and stable  Complications: No apparent anesthesia complications

## 2013-11-26 NOTE — Anesthesia Preprocedure Evaluation (Signed)
Anesthesia Evaluation  Patient identified by MRN, date of birth, ID band Patient awake    Reviewed: Allergy & Precautions, H&P , NPO status , Patient's Chart, lab work & pertinent test results  Airway Mallampati: II TM Distance: <3 FB Neck ROM: Full    Dental  (+) Teeth Intact   Pulmonary neg pulmonary ROS, former smoker,  breath sounds clear to auscultation        Cardiovascular negative cardio ROS  Rhythm:Regular Rate:Normal     Neuro/Psych    GI/Hepatic GERD-  Controlled and Medicated,Reflux just with certain foods, no problems recently.   Endo/Other  Hypothyroidism   Renal/GU      Musculoskeletal   Abdominal   Peds  Hematology   Anesthesia Other Findings   Reproductive/Obstetrics                           Anesthesia Physical Anesthesia Plan  ASA: II  Anesthesia Plan: General   Post-op Pain Management:    Induction: Intravenous  Airway Management Planned: Oral ETT  Additional Equipment:   Intra-op Plan:   Post-operative Plan: Extubation in OR  Informed Consent: I have reviewed the patients History and Physical, chart, labs and discussed the procedure including the risks, benefits and alternatives for the proposed anesthesia with the patient or authorized representative who has indicated his/her understanding and acceptance.   Dental advisory given  Plan Discussed with:   Anesthesia Plan Comments:         Anesthesia Quick Evaluation

## 2013-11-26 NOTE — Op Note (Signed)
Catherine [name], Catherine [name] NO.:  [id number]  MEDICAL RECORD NO.:  [id number]  LOCATION:  [facility]                          FACILITY:  [facility]  PHYSICIAN:  Felicie Morn, M.D. DATE OF BIRTH:  06/18/1975  DATE OF PROCEDURE:  11/26/2013 DATE OF DISCHARGE:                              OPERATIVE REPORT   PREOPERATIVE DIAGNOSES:  Cholecystitis, cholelithiasis (acute.)  POSTOPERATIVE DIAGNOSES:  Cholecystitis, cholelithiasis (acute.)  PROCEDURE:  Laparoscopic cholecystectomy.  SPECIMEN:  Gallbladder and stones.  WOUND CLASSIFICATION:  Clean, contaminated.  NOTE:  This is a 38 year old obese white female who was seen in the emergency room with nausea and vomiting and right upper quadrant pain and was found later to have on her return by sonogram cholecystitis, cholelithiasis.  She was referred to my office.  We planned for semi- elective laparoscopic cholecystectomy as the patient refused admission at that time due to family concerns.  She was placed on a restrictive diet and did well.  We saw her as an outpatient and she was admitted and we proceeded with the surgery.  Prior to surgery, we discussed complications not limited to, but including bleeding, infection, damage to bile ducts, perforation of organs, transitory diarrhea, and the possibility that open surgery might be required.  GROSS OPERATIVE FINDINGS:  The patient's gallbladder was very shrunken as the patient had not eaten very much, since I had seen her as an outpatient.  She had a small cystic duct and very small stones within the gallbladder.  Otherwise, right upper quadrant appeared to be within normal limits.  TECHNIQUE:  The patient was placed in supine position.  After the adequate administration of general anesthesia, and the placement aseptically of a Foley catheter, she was prepped with Betadine solution and draped in the usual manner.  A periumbilical incision was carried out over the superior  aspect of the umbilicus down to the fascia which was grasped with a sharp towel clip and elevated and a Veress needle was inserted and confirmed in position with a saline drop test.  We then using the Visiport technique placed 11 mm cannula in the umbilicus and then under direct vision, an 11 mm cannula in the epigastrium and two 5 mm cannulas in the right upper quadrant laterally.  The gallbladder was grasped.  Its adhesions were minimal and we dissected the cystic duct and the cystic artery without any problem.  These were triply silver clipped and the gallbladder was removed without any spillage using the hook cautery device.  There was very little bleeding as the patient had somewhat of a mesentery to her gallbladder and we were able to and that there was very little bleeding.  We then after removal of the gallbladder with the endosac device, we then irrigated with normal saline solution.  I elected to leave a Jackson-Pratt drain in the liver bed and Surgicel in the liver bed as well.  We then after checking for hemostasis, desufflated the abdomen and then closed the incisions with 0 Polysorb in the area of the umbilicus in the epigastrium and then used surgical clips to close the skin.  This drain was sutured in place with 3-0 nylon.  Prior to  closure, all sponge, needle, and instrument counts were found to be correct.  Estimated blood loss was less than 20 mL.  She received 1100 mL crystalloids intraoperatively.  There were no complications.     Felicie Morn, M.D.     WB/MEDQ  D:  11/26/2013  T:  11/26/2013  Job:  [id number]

## 2013-11-26 NOTE — Progress Notes (Signed)
Notified Dr. Romona Curls due to patient request for percocet.  She says it works better for her than dilauidid.  New orders given and followed.

## 2013-11-27 LAB — HEPATIC FUNCTION PANEL
ALBUMIN: 3.2 g/dL — AB (ref 3.5–5.2)
ALT: 27 U/L (ref 0–35)
AST: 24 U/L (ref 0–37)
Alkaline Phosphatase: 63 U/L (ref 39–117)
Bilirubin, Direct: <0.2 mg/dL (ref 0.0–0.3)
TOTAL PROTEIN: 6.9 g/dL (ref 6.0–8.3)
Total Bilirubin: 0.5 mg/dL (ref 0.3–1.2)

## 2013-11-27 LAB — BASIC METABOLIC PANEL
BUN: 7 mg/dL (ref 6–23)
CALCIUM: 8.6 mg/dL (ref 8.4–10.5)
CO2: 25 mEq/L (ref 19–32)
CREATININE: 0.75 mg/dL (ref 0.50–1.10)
Chloride: 102 mEq/L (ref 96–112)
Glucose, Bld: 109 mg/dL — ABNORMAL HIGH (ref 70–99)
Potassium: 4 mEq/L (ref 3.7–5.3)
SODIUM: 138 meq/L (ref 137–147)

## 2013-11-27 LAB — CBC
HCT: 34.4 % — ABNORMAL LOW (ref 36.0–46.0)
Hemoglobin: 11.2 g/dL — ABNORMAL LOW (ref 12.0–15.0)
MCH: 29.9 pg (ref 26.0–34.0)
MCHC: 32.6 g/dL (ref 30.0–36.0)
MCV: 91.7 fL (ref 78.0–100.0)
PLATELETS: 245 10*3/uL (ref 150–400)
RBC: 3.75 MIL/uL — ABNORMAL LOW (ref 3.87–5.11)
RDW: 13.3 % (ref 11.5–15.5)
WBC: 10.4 10*3/uL (ref 4.0–10.5)

## 2013-11-27 MED ORDER — DSS 100 MG PO CAPS: 100.0000 mg | ORAL_CAPSULE | Freq: Every day | ORAL | Status: DC

## 2013-11-27 NOTE — Progress Notes (Signed)
POD #1  Filed Vitals:   11/27/13 0503  BP: 144/89  Pulse: 55  Temp: 97.5 F (36.4 C)  Resp: 16    Pt doing very well post op.  Abdomen soft and tolerating PO well and voiding without dysuria.  Wound clean and dry.  Minimal JP out put, have removed drain and will plan for discharge.  Labs OK.  Discharge and follow up arranged.  Discharge dictated, # [id number].

## 2013-11-27 NOTE — Discharge Summary (Signed)
NAMEMARISABEL, [name] NO.:  [id number]  MEDICAL RECORD NO.:  [id number]  LOCATION:  [id number]                          FACILITY:  [facility]  PHYSICIAN:  Felicie Morn, M.D. DATE OF BIRTH:  April 02, 1976  DATE OF ADMISSION:  11/26/2013 DATE OF DISCHARGE:  06/20/2015LH                              DISCHARGE SUMMARY   DIAGNOSES:  Cholecystitis, cholelithiasis.  PROCEDURE:  Laparoscopic cholecystectomy.  NOTE:  This is a 38 year old white female who had right upper quadrant pain, nausea, and vomiting, and sonogram which revealed cholelithiasis. She was seen through the emergency room and later in my office and we planned for admission via the outpatient department for laparoscopic cholecystectomy.  The patient's hospital course was unremarkable.  She underwent a laparoscopic cholecystectomy on November 26, 2013.  On the postoperative day she was doing well.  She was tolerating p.o. well.  She had no dysuria, leg pain, or chest pain, and she was tolerating p.o. without any problems.  Her liver function studies were all grossly within normal limits.  Her labs were acceptable.  She had minimal discomfort.  Her wound was clean, and there was very little Jackson-Pratt drain less than 10 mL of drainage noted.  We removed the Jackson-Pratt drain on the first postoperative day and she was discharged.  We have made followup arrangements with her, and we will plan to see her and follow her perioperatively.     Felicie Morn, M.D.     WB/MEDQ  D:  11/27/2013  T:  11/27/2013  Job:  [id number]

## 2013-11-27 NOTE — Progress Notes (Signed)
Patient discharged with instructions, prescription, and care notes.  Verbalized understanding via teach back.  IV was removed and the site was WNL. Patient voiced no further complaints or concerns at the time of discharge.  Appointments scheduled per instructions.  Patient left the floor via w/c with staff and family in stable condition. 

## 2013-11-29 ENCOUNTER — Encounter (HOSPITAL_COMMUNITY): Payer: Self-pay | Admitting: General Surgery

## 2014-02-22 ENCOUNTER — Encounter (HOSPITAL_COMMUNITY): Payer: Self-pay | Admitting: Emergency Medicine

## 2014-02-22 ENCOUNTER — Emergency Department (HOSPITAL_COMMUNITY)
Admission: EM | Admit: 2014-02-22 | Discharge: 2014-02-22 | Disposition: A | Discharge status: Home / Self Care | Payer: Managed Care, Other (non HMO) | Attending: Emergency Medicine | Admitting: Emergency Medicine

## 2014-02-22 DIAGNOSIS — Z87891 Personal history of nicotine dependence: Secondary | ICD-10-CM | POA: Insufficient documentation

## 2014-02-22 DIAGNOSIS — M549 Dorsalgia, unspecified: Secondary | ICD-10-CM | POA: Insufficient documentation

## 2014-02-22 DIAGNOSIS — R519 Headache, unspecified: Secondary | ICD-10-CM

## 2014-02-22 DIAGNOSIS — Z8719 Personal history of other diseases of the digestive system: Secondary | ICD-10-CM | POA: Diagnosis not present

## 2014-02-22 DIAGNOSIS — E039 Hypothyroidism, unspecified: Secondary | ICD-10-CM | POA: Insufficient documentation

## 2014-02-22 DIAGNOSIS — R110 Nausea: Secondary | ICD-10-CM | POA: Diagnosis not present

## 2014-02-22 DIAGNOSIS — Z79899 Other long term (current) drug therapy: Secondary | ICD-10-CM | POA: Diagnosis not present

## 2014-02-22 DIAGNOSIS — R51 Headache: Secondary | ICD-10-CM | POA: Insufficient documentation

## 2014-02-22 MED ORDER — CELECOXIB 200 MG PO CAPS: 200.0000 mg | ORAL_CAPSULE | Freq: Two times a day (BID) | ORAL | Status: DC

## 2014-02-22 MED ORDER — DIAZEPAM 5 MG PO TABS: ORAL_TABLET | ORAL | Status: DC

## 2014-02-22 MED ORDER — DEXAMETHASONE SODIUM PHOSPHATE 4 MG/ML IJ SOLN
8.0000 mg | Freq: Once | INTRAMUSCULAR | Status: AC
  Administered 2014-02-22: 8 mg via INTRAMUSCULAR
  Filled 2014-02-22: qty 2

## 2014-02-22 MED ORDER — DIAZEPAM 5 MG PO TABS
10.0000 mg | ORAL_TABLET | Freq: Once | ORAL | Status: AC
  Administered 2014-02-22: 10 mg via ORAL
  Filled 2014-02-22: qty 2

## 2014-02-22 MED ORDER — KETOROLAC TROMETHAMINE 10 MG PO TABS
10.0000 mg | ORAL_TABLET | Freq: Once | ORAL | Status: AC
  Administered 2014-02-22: 10 mg via ORAL
  Filled 2014-02-22: qty 1

## 2014-02-22 NOTE — ED Notes (Signed)
Patient with no complaints at this time. Respirations even and unlabored. Skin warm/dry. Discharge instructions reviewed with patient at this time. Patient given opportunity to voice concerns/ask questions. Patient discharged at this time and left Emergency Department with steady gait.   

## 2014-02-22 NOTE — ED Notes (Signed)
PT c/o left frontal and right occipital headache worsening x3 weeks. PT has seen primary doctor with new prescriptions and has a referral made with a neurologist. PT c/o nausea, sound sensivity, floaters in visual field.

## 2014-02-22 NOTE — ED Notes (Signed)
MD at bedside. 

## 2014-02-22 NOTE — ED Provider Notes (Signed)
CSN: [id number]     Arrival date & time 02/22/14  0915 History   First MD Initiated Contact with Patient 02/22/14 0919     Chief Complaint  Patient presents with  . Headache     (Consider location/radiation/quality/duration/timing/severity/associated sxs/prior Treatment) Patient is a 38 y.o. female presenting with headaches. The history is provided by the patient.  Headache Pain location:  R parietal, R temporal and frontal Radiates to:  R neck Severity currently:  8/10 Onset quality:  Gradual Duration:  3 weeks Timing:  Intermittent Progression:  Worsening Chronicity:  Recurrent Similar to prior headaches: yes   Context: activity and loud noise   Relieved by:  Nothing Worsened by:  Activity Ineffective treatments: maxalt and imitrex. Associated symptoms: back pain and nausea   Associated symptoms: no abdominal pain, no cough, no diarrhea, no dizziness, no fever, no loss of balance, no neck pain, no photophobia, no seizures, no sore throat, no syncope, no tingling and no vomiting   Risk factors: no family hx of [place]     Past Medical History  Diagnosis Date  . Hypothyroidism   . GERD (gastroesophageal reflux disease)    Past Surgical History  Procedure Laterality Date  . Bladder surgery      reports had bladder stretched as child  . Wisdom tooth extraction    . Abdominal hysterectomy N/A 10/06/2013    Procedure: HYSTERECTOMY ABDOMINAL;  Surgeon: Florian Buff, MD;  Location: [facility];  Service: Gynecology;  Laterality: N/A;  . Bilateral salpingectomy Bilateral 10/06/2013    Procedure: BILATERAL SALPINGECTOMY;  Surgeon: Florian Buff, MD;  Location: [facility];  Service: Gynecology;  Laterality: Bilateral;  . Cholecystectomy N/A 11/26/2013    Procedure: LAPAROSCOPIC CHOLECYSTECTOMY;  Surgeon: Scherry Ran, MD;  Location: [facility];  Service: General;  Laterality: N/A;   Family History  Problem Relation Age of Onset  . Heart disease Father   . Hypertension Father   . Cancer  Maternal Aunt   . Thyroid disease Maternal Aunt   . Cancer Paternal Aunt   . Thyroid disease Maternal Grandmother   . Cancer Maternal Grandmother   . Diabetes Maternal Grandfather   . Hypertension Maternal Grandfather    History  Substance Use Topics  . Smoking status: Former Smoker -- 0.50 packs/day for 19 years    Types: Cigarettes    Quit date: 10/08/2011  . Smokeless tobacco: Never Used  . Alcohol Use: 0.0 oz/week     Comment: very rarley   OB History   Grav Para Term Preterm Abortions TAB SAB Ect Mult Living   1 0 0 0 1 0 1 0 0 0      Review of Systems  Constitutional: Negative for fever and activity change.       All ROS Neg except as noted in HPI  HENT: Negative for nosebleeds and sore throat.   Eyes: Negative for photophobia and discharge.  Respiratory: Negative for cough, shortness of breath and wheezing.   Cardiovascular: Negative for chest pain, palpitations and syncope.  Gastrointestinal: Positive for nausea. Negative for vomiting, abdominal pain, diarrhea and blood in stool.  Genitourinary: Negative for dysuria, frequency and hematuria.  Musculoskeletal: Positive for back pain. Negative for arthralgias and neck pain.  Skin: Negative.   Neurological: Positive for headaches. Negative for dizziness, seizures, speech difficulty and loss of balance.  Psychiatric/Behavioral: Negative for hallucinations and confusion.      Allergies  Morphine and related and Sulfa antibiotics  Home Medications   Prior to  Admission medications   Medication Sig Start Date End Date Taking? Authorizing Provider  levothyroxine (SYNTHROID, LEVOTHROID) 112 MCG tablet Take 112 mcg by mouth daily before breakfast.   Yes Historical Provider, MD  naproxen sodium (ALEVE) 220 MG tablet Take 220 mg by mouth 2 (two) times daily as needed (headache).   Yes Historical Provider, MD  rizatriptan (MAXALT-MLT) 10 MG disintegrating tablet Take 10 mg by mouth as needed for migraine.  02/18/14  Yes  Historical Provider, MD  SUMAtriptan (IMITREX) 100 MG tablet Take 100 mg by mouth every 2 (two) hours as needed for migraine.  02/17/14  Yes Historical Provider, MD   BP 139/98  Pulse 69  Temp(Src) 97.8 F (36.6 C) (Oral)  Resp 18  Ht 5\' 9"  (1.753 m)  Wt 235 lb (106.595 kg)  BMI 34.69 kg/m2  SpO2 98%  LMP 09/18/2013 Physical Exam  Nursing note and vitals reviewed. Constitutional: She is oriented to person, place, and time. She appears well-developed and well-nourished.  Non-toxic appearance.  HENT:  Head: Normocephalic.  Right Ear: Tympanic membrane and external ear normal.  Left Ear: Tympanic membrane and external ear normal.  Eyes: EOM and lids are normal. Pupils are equal, round, and reactive to light.  Neck: Normal range of motion. Neck supple. Carotid bruit is not present.  Cardiovascular: Normal rate, regular rhythm, normal heart sounds, intact distal pulses and normal pulses.   Pulmonary/Chest: Breath sounds normal. No respiratory distress.  Abdominal: Soft. Bowel sounds are normal. There is no tenderness. There is no guarding.  Musculoskeletal: Normal range of motion.  Lymphadenopathy:       Head (right side): No submandibular adenopathy present.       Head (left side): No submandibular adenopathy present.    She has no cervical adenopathy.  Neurological: She is alert and oriented to person, place, and time. She has normal strength. No cranial nerve deficit or sensory deficit. She exhibits normal muscle tone. Coordination normal.  Speech clear and understandable Gait steady.  Skin: Skin is warm and dry.  Psychiatric: She has a normal mood and affect. Her speech is normal.    ED Course  Procedures (including critical care time) Labs Review Labs Reviewed - No data to display  Imaging Review No results found.   EKG Interpretation None      MDM  Pt states she's been having a problem with migraines, but has not had a formal diagnosis of migraine headache. She's  not had any injury or trauma to the head. His been no changes in the medications or diet recently. She denies any thunderclap-type headache. There no gross neurologic deficits appreciated after 3 weeks of headache or problems. The pain seems to be mostly in the temporal areas, the frontal area, and radiating to the posterior right neck. There is no pain over the temporomandibular joint at this time. Suspect the patient has a tension/muscle contraction type headache. No imaging is required at this time. Vital signs are within normal limits with exception of the blood pressure being 139/98. The pulse oximetry is 98% on room air. Within normal limits by my interpretation.  I have discussed in detail the findings on the examination. Patient will be treated with Celebrex and Valium. Patient has been given strict instructions to return immediately if any changes, problems, or concerns. The patient is to also schedule an appointment with her primary physician for continuity of care.    Final diagnoses:  None    *I have reviewed nursing notes, vital signs,  and all appropriate lab and imaging results for this patient.Lenox Ahr, PA-C 02/23/14 [id number]

## 2014-02-22 NOTE — Discharge Instructions (Signed)
Headaches, Frequently Asked Questions Please see the neurologist as scheduled. Please use celebrex and valium for headache if needed. Valium may cause drowsiness, use with caution. MIGRAINE HEADACHES Q: What is migraine? What causes it? How can I treat it? A: Generally, migraine headaches begin as a dull ache. Then they develop into a constant, throbbing, and pulsating pain. You may experience pain at the temples. You may experience pain at the front or back of one or both sides of the head. The pain is usually accompanied by a combination of:  Nausea.  Vomiting.  Sensitivity to light and noise. Some people (about 15%) experience an aura (see below) before an attack. The cause of migraine is believed to be chemical reactions in the brain. Treatment for migraine may include over-the-counter or prescription medications. It may also include self-help techniques. These include relaxation training and biofeedback.  Q: What is an aura? A: About 15% of people with migraine get an "aura". This is a sign of neurological symptoms that occur before a migraine headache. You may see wavy or jagged lines, dots, or flashing lights. You might experience tunnel vision or blind spots in one or both eyes. The aura can include visual or auditory hallucinations (something imagined). It may include disruptions in smell (such as strange odors), taste or touch. Other symptoms include:  Numbness.  A "pins and needles" sensation.  Difficulty in recalling or speaking the correct word. These neurological events may last as long as 60 minutes. These symptoms will fade as the headache begins. Q: What is a trigger? A: Certain physical or environmental factors can lead to or "trigger" a migraine. These include:  Foods.  Hormonal changes.  Weather.  Stress. It is important to remember that triggers are different for everyone. To help prevent migraine attacks, you need to figure out which triggers affect you. Keep a  headache diary. This is a good way to track triggers. The diary will help you talk to your healthcare professional about your condition. Q: Does weather affect migraines? A: Bright sunshine, hot, humid conditions, and drastic changes in barometric pressure may lead to, or "trigger," a migraine attack in some people. But studies have shown that weather does not act as a trigger for everyone with migraines. Q: What is the link between migraine and hormones? A: Hormones start and regulate many of your body's functions. Hormones keep your body in balance within a constantly changing environment. The levels of hormones in your body are unbalanced at times. Examples are during menstruation, pregnancy, or menopause. That can lead to a migraine attack. In fact, about three quarters of all women with migraine report that their attacks are related to the menstrual cycle.  Q: Is there an increased risk of stroke for migraine sufferers? A: The likelihood of a migraine attack causing a stroke is very remote. That is not to say that migraine sufferers cannot have a stroke associated with their migraines. In persons under age 68, the most common associated factor for stroke is migraine headache. But over the course of a person's normal life span, the occurrence of migraine headache may actually be associated with a reduced risk of dying from cerebrovascular disease due to stroke.  Q: What are acute medications for migraine? A: Acute medications are used to treat the pain of the headache after it has started. Examples over-the-counter medications, NSAIDs, ergots, and triptans.  Q: What are the triptans? A: Triptans are the newest class of abortive medications. They are specifically targeted to treat  migraine. Triptans are vasoconstrictors. They moderate some chemical reactions in the brain. The triptans work on receptors in your brain. Triptans help to restore the balance of a neurotransmitter called serotonin.  Fluctuations in levels of serotonin are thought to be a main cause of migraine.  Q: Are over-the-counter medications for migraine effective? A: Over-the-counter, or "OTC," medications may be effective in relieving mild to moderate pain and associated symptoms of migraine. But you should see your caregiver before beginning any treatment regimen for migraine.  Q: What are preventive medications for migraine? A: Preventive medications for migraine are sometimes referred to as "prophylactic" treatments. They are used to reduce the frequency, severity, and length of migraine attacks. Examples of preventive medications include antiepileptic medications, antidepressants, beta-blockers, calcium channel blockers, and NSAIDs (nonsteroidal anti-inflammatory drugs). Q: Why are anticonvulsants used to treat migraine? A: During the past few years, there has been an increased interest in antiepileptic drugs for the prevention of migraine. They are sometimes referred to as "anticonvulsants". Both epilepsy and migraine may be caused by similar reactions in the brain.  Q: Why are antidepressants used to treat migraine? A: Antidepressants are typically used to treat people with depression. They may reduce migraine frequency by regulating chemical levels, such as serotonin, in the brain.  Q: What alternative therapies are used to treat migraine? A: The term "alternative therapies" is often used to describe treatments considered outside the scope of conventional Western medicine. Examples of alternative therapy include acupuncture, acupressure, and yoga. Another common alternative treatment is herbal therapy. Some herbs are believed to relieve headache pain. Always discuss alternative therapies with your caregiver before proceeding. Some herbal products contain arsenic and other toxins. TENSION HEADACHES Q: What is a tension-type headache? What causes it? How can I treat it? A: Tension-type headaches occur randomly. They  are often the result of temporary stress, anxiety, fatigue, or anger. Symptoms include soreness in your temples, a tightening band-like sensation around your head (a "vice-like" ache). Symptoms can also include a pulling feeling, pressure sensations, and contracting head and neck muscles. The headache begins in your forehead, temples, or the back of your head and neck. Treatment for tension-type headache may include over-the-counter or prescription medications. Treatment may also include self-help techniques such as relaxation training and biofeedback. CLUSTER HEADACHES Q: What is a cluster headache? What causes it? How can I treat it? A: Cluster headache gets its name because the attacks come in groups. The pain arrives with little, if any, warning. It is usually on one side of the head. A tearing or bloodshot eye and a runny nose on the same side of the headache may also accompany the pain. Cluster headaches are believed to be caused by chemical reactions in the brain. They have been described as the most severe and intense of any headache type. Treatment for cluster headache includes prescription medication and oxygen. SINUS HEADACHES Q: What is a sinus headache? What causes it? How can I treat it? A: When a cavity in the bones of the face and skull (a sinus) becomes inflamed, the inflammation will cause localized pain. This condition is usually the result of an allergic reaction, a tumor, or an infection. If your headache is caused by a sinus blockage, such as an infection, you will probably have a fever. An x-ray will confirm a sinus blockage. Your caregiver's treatment might include antibiotics for the infection, as well as antihistamines or decongestants.  REBOUND HEADACHES Q: What is a rebound headache? What causes it? How can  I treat it? A: A pattern of taking acute headache medications too often can lead to a condition known as "rebound headache." A pattern of taking too much headache medication  includes taking it more than 2 days per week or in excessive amounts. That means more than the label or a caregiver advises. With rebound headaches, your medications not only stop relieving pain, they actually begin to cause headaches. Doctors treat rebound headache by tapering the medication that is being overused. Sometimes your caregiver will gradually substitute a different type of treatment or medication. Stopping may be a challenge. Regularly overusing a medication increases the potential for serious side effects. Consult a caregiver if you regularly use headache medications more than 2 days per week or more than the label advises. ADDITIONAL QUESTIONS AND ANSWERS Q: What is biofeedback? A: Biofeedback is a self-help treatment. Biofeedback uses special equipment to monitor your body's involuntary physical responses. Biofeedback monitors:  Breathing.  Pulse.  Heart rate.  Temperature.  Muscle tension.  Brain activity. Biofeedback helps you refine and perfect your relaxation exercises. You learn to control the physical responses that are related to stress. Once the technique has been mastered, you do not need the equipment any more. Q: Are headaches hereditary? A: Four out of five (80%) of people that suffer report a family history of migraine. Scientists are not sure if this is genetic or a family predisposition. Despite the uncertainty, a child has a 50% chance of having migraine if one parent suffers. The child has a 75% chance if both parents suffer.  Q: Can children get headaches? A: By the time they reach high school, most young people have experienced some type of headache. Many safe and effective approaches or medications can prevent a headache from occurring or stop it after it has begun.  Q: What type of doctor should I see to diagnose and treat my headache? A: Start with your primary caregiver. Discuss his or her experience and approach to headaches. Discuss methods of  classification, diagnosis, and treatment. Your caregiver may decide to recommend you to a headache specialist, depending upon your symptoms or other physical conditions. Having diabetes, allergies, etc., may require a more comprehensive and inclusive approach to your headache. The National Headache Foundation will provide, upon request, a list of [facility] physician members in your state. Document Released: 08/17/2003 Document Revised: 08/19/2011 Document Reviewed: 01/25/2008 [facility] Patient Information 2015 [place], [place]. This information is not intended to replace advice given to you by your health care provider. Make sure you discuss any questions you have with your health care provider.

## 2014-02-23 ENCOUNTER — Encounter: Payer: Self-pay | Admitting: Neurology

## 2014-02-23 ENCOUNTER — Ambulatory Visit (INDEPENDENT_AMBULATORY_CARE_PROVIDER_SITE_OTHER): Payer: Managed Care, Other (non HMO) | Admitting: Neurology

## 2014-02-23 VITALS — BP 145/87 | HR 89 | Ht 69.0 in | Wt 241.2 lb

## 2014-02-23 DIAGNOSIS — G43701 Chronic migraine without aura, not intractable, with status migrainosus: Secondary | ICD-10-CM

## 2014-02-23 DIAGNOSIS — R51 Headache: Secondary | ICD-10-CM

## 2014-02-23 MED ORDER — TOPIRAMATE 25 MG PO TABS: ORAL_TABLET | ORAL | Status: DC

## 2014-02-23 MED ORDER — DEXAMETHASONE 4 MG PO TABS: ORAL_TABLET | ORAL | Status: DC

## 2014-02-23 NOTE — Patient Instructions (Addendum)
Overall you are doing fairly well but I do want to suggest a few things today:   Remember to drink plenty of fluid, eat healthy meals and do not skip any meals. Try to eat protein with a every meal and eat a healthy snack such as fruit or nuts in between meals. Try to keep a regular sleep-wake schedule and try to exercise daily, particularly in the form of walking, 20-30 minutes a day, if you can.   As far as your medications are concerned, I would like to suggest; Starting topamax. 25mg  at night after 5 days can increase to 50mg (2 tabs) at night.  Decadron as prescribed. Discussed common side effects.  MRI of the brain.  I would like to see you back in 3 months, sooner if we need to. Please call us with any interim questions, concerns, problems, updates or refill requests.   Please also call us for any test results so we can go over those with you on the phone.  My clinical assistant and will answer any of your questions and relay your messages to me and also relay most of my messages to you.   Our phone number is [phone]. We also have an after hours call service for urgent matters and there is a physician on-call for urgent questions. For any emergencies you know to call 911 or go to the nearest emergency room

## 2014-02-23 NOTE — Progress Notes (Signed)
Catherine [name]   [facility]    Provider:  Dr Jaynee Eagles Referring Provider: Velta [name], Catherine Cooper, DO Primary Care Physician:  No PCP Per Patient  CC:  Headaches HPI:  Catherine [name] is a 38 y.o. female here as a referral from Dr. Velta [name] for headaches.Patient with a PMHx of headaches, hypothyroidism. She has had a persistent headache for more than 5 days. She does have a PMHx of chronic headaches. The chronic headaches are pressure in the frontal area. Usually gets them a few times a week, more with menstruation. In the past would last up to a day and they would just go away. Would sometimes get up to a 10. On average 8-10/10. Has had headaches as long as she can remember, from a child. Would take OTC meds multiple times a day, 5 days a week.  In the past, was prescribed imitrex which had a side effect and then tried maxalt which worked for the first couple of days - but headache would come right back.   Currently, persistent headache will not go away. Has taken 5 maxalts in the last 5 days without improvement. Monday the maxalt made the headache worse. yesterady went to the ED. Was given valium and celebrex and still has a headahce. Left frontal and right occipital areas. Feels like opressure and can vary from dull to sharp. Stabbing pain right now. Pain is 6-7/10 currently has been today 10/10. Not sleeping well. Feels better when she is laying down. This headache feels different than previous headaches. Never had it this long. No head trauma. Unknown trigger now and in the past. No focal neurologic deficits.   Reviewed notes, labs and imaging from outside physicians, which showed: CBC, CMP, TSH WNL 11/2013. Cat scan head 2010 wnl  Review of Systems: Patient complains of symptoms per HPI as well as the following symptoms fevers or chills, fatigue, swelling, feeling cold, flushing, joint pain, aching muscles, birth marks, allergies, headache. Pertinent negatives per HPI. Otherwise out of a complete  14 system review, and all other reviewed systems are negative.   History   Social History  . Marital Status: Married    Spouse Name: [name]     Number of Children: 0  . Years of Education: 12   Occupational History  .     Social History Main Topics  . Smoking status: Former Smoker -- 0.50 packs/day for 19 years    Types: Cigarettes    Quit date: 10/08/2011  . Smokeless tobacco: Never Used  . Alcohol Use: 0.0 oz/week     Comment: very rarley  . Drug Use: No  . Sexual Activity: Yes   Other Topics Concern  . Not on file   Social History Narrative   Patient is a non smoker.   Patient works full-time.   Patient is married.    Patient has a high school education.    Patient works at [employer] History  Problem Relation Age of Onset  . Heart disease Father   . Hypertension Father   . Cancer Maternal Aunt   . Thyroid disease Maternal Aunt   . Cancer Paternal Aunt   . Thyroid disease Maternal Grandmother   . Cancer Maternal Grandmother   . Diabetes Maternal Grandfather   . Hypertension Maternal Grandfather     Past Medical History  Diagnosis Date  . Hypothyroidism   . GERD (gastroesophageal reflux disease)   . FHx: allergies   . Kidney stones   .  Allergic rhinitis   . H/O scarlet fever     Past Surgical History  Procedure Laterality Date  . Bladder surgery      reports had bladder stretched as child  . Wisdom tooth extraction    . Abdominal hysterectomy N/A 10/06/2013    Procedure: HYSTERECTOMY ABDOMINAL;  Surgeon: Florian Buff, MD;  Location: [facility];  Service: Gynecology;  Laterality: N/A;  . Bilateral salpingectomy Bilateral 10/06/2013    Procedure: BILATERAL SALPINGECTOMY;  Surgeon: Florian Buff, MD;  Location: [facility];  Service: Gynecology;  Laterality: Bilateral;  . Cholecystectomy N/A 11/26/2013    Procedure: LAPAROSCOPIC CHOLECYSTECTOMY;  Surgeon: Scherry Ran, MD;  Location: [facility];  Service: General;  Laterality: N/A;    Current  Outpatient Prescriptions  Medication Sig Dispense Refill  . celecoxib (CELEBREX) 200 MG capsule Take 1 capsule (200 mg total) by mouth 2 (two) times daily.  14 capsule  0  . dexamethasone (DECADRON) 4 MG tablet Take 3 tablets on day one. Take 2 tablets on day 2. Take 1 tablet on day 3.  6 tablet  0  . diazepam (VALIUM) 5 MG tablet 1 po tid prn headache  21 tablet  0  . levothyroxine (SYNTHROID, LEVOTHROID) 112 MCG tablet Take 112 mcg by mouth daily before breakfast.      . topiramate (TOPAMAX) 25 MG tablet Start with one tab before bed. In 5 days may increase to two tabs before bed.  60 tablet  3   No current facility-administered medications for this visit.    Allergies as of 02/23/2014 - Review Complete 02/22/2014  Allergen Reaction Noted  . Morphine and related Other (See Comments) 02/19/2013  . Sulfa antibiotics Other (See Comments) 02/19/2013    Vitals: BP 145/87  Pulse 89  Ht 5\' 9"  (1.753 m)  Wt 241 lb 3.2 oz (109.408 kg)  BMI 35.60 kg/m2  LMP 09/18/2013 Last Weight:  Wt Readings from Last 1 Encounters:  02/23/14 241 lb 3.2 oz (109.408 kg)   Last Height:   Ht Readings from Last 1 Encounters:  02/23/14 5\' 9"  (1.753 m)     Physical exam: Exam: Gen: NAD, conversant Eyes: anicteric sclerae, moist conjunctivae HENT: Atraumatic, oropharynx clear Neck: Trachea midline; supple,  Lungs: CTA, no wheezing, rales, rhonic                          CV: RRR, no MRG Abdomen: Soft, non-tender;  Extremities: No peripheral edema  Skin: Normal temperature, no rash,  Psych: Appropriate affect, pleasant  Neuro: Detailed Neurologic Exam  Speech:    Speech is normal; fluent and spontaneous with normal comprehension.  Cognition:    The patient is oriented to person, place, and time; memory intact; language fluent; normal attention, concentration, and fund of knowledge.  Cranial Nerves:    The pupils are equal, round, and reactive to light. The fundi are normal and spontaneous  venous pulsations are present. Visual fields are full to finger confrontation. Extraocular movements are intact. Trigeminal sensation is intact and the muscles of mastication are normal. The face is symmetric. The palate elevates in the midline. Voice is normal. Shoulder shrug is normal. The tongue has normal motion without fasciculations.   Coordination:    Normal finger to nose and heel to shin. Normal rapid alternating movements.   Gait:    Heel-toe and tandem gait are normal.   Motor Observation:    No asymmetry, no atrophy, and no involuntary movements  noted. Tone:    Normal muscle tone.    Posture:    Posture is normal. normal erect    Strength:    Strength is V/V in the upper and lower limbs.       Vibratory Sensation:    Normal vibratory sensation in upper and lower extremities.   Light Touch:    Normal light touch sensation in upper and lower extremities.     Proprioception:    Normal proprioception in upper and lower extremities.  Pin Prick:    Normal sensation to pinprick in upper and lower extremities.    Temperature:    Normal temperature sensation in upper and lower extremities.  Reflex Exam:  DTR's:    Deep tendon reflexes in the upper and lower extremities are normal bilaterally.   Toes:    The toes are downgoing bilaterally.   Clonus:    Clonus is absent.     Assessment/Plan:  38 year old patient in status migrainosus. Has chronic tension-type headaches. Performed a nerve block in clinic with improvement of headache. Neuro exam non focal. Will start on topamax.  Patient was consented for bilateral occipital and trigeminal nerve blocks. A solution containing 0.5% Bupivacaine 8-cc was prepared in a 10 CC syringe.   8 Target areas in the suboccipital and temporal regions were identified via palpation and pain response.The sites junctions were sterilized with alcohol wipes. 53ml was injected at each site. The contents of each syringe was injected in  a fanlike fashion on each side. The headache improved. Patient tolerated the procedure well and no complications were noted.    Patient instructed on the following: Tomorrow take 3 tabs of 4mg  oral decadron Day 2 take 2 tabs 4mg  oral decadron Day 3 take 1 tab 4mg  decadron  Follow up 3 months   Sarina Ill, MD  [facility] [address] [place], [place] [address]  Phone [phone] Fax [phone]

## 2014-02-23 NOTE — ED Provider Notes (Signed)
Medical screening examination/treatment/procedure(s) were performed by non-physician practitioner and as supervising physician I was immediately available for consultation/collaboration.   EKG Interpretation None        Sharyon Cable, MD 02/23/14 1539

## 2014-02-24 ENCOUNTER — Encounter: Payer: Self-pay | Admitting: Neurology

## 2014-02-24 DIAGNOSIS — G43901 Migraine, unspecified, not intractable, with status migrainosus: Secondary | ICD-10-CM | POA: Insufficient documentation

## 2014-03-03 ENCOUNTER — Ambulatory Visit (INDEPENDENT_AMBULATORY_CARE_PROVIDER_SITE_OTHER): Payer: Managed Care, Other (non HMO) | Admitting: Neurology

## 2014-03-03 ENCOUNTER — Telehealth: Payer: Self-pay | Admitting: *Deleted

## 2014-03-03 ENCOUNTER — Ambulatory Visit (INDEPENDENT_AMBULATORY_CARE_PROVIDER_SITE_OTHER): Payer: Managed Care, Other (non HMO)

## 2014-03-03 ENCOUNTER — Other Ambulatory Visit: Payer: Self-pay | Admitting: Neurology

## 2014-03-03 DIAGNOSIS — R51 Headache: Secondary | ICD-10-CM

## 2014-03-03 MED ORDER — TOPIRAMATE 50 MG PO TABS: 50.0000 mg | ORAL_TABLET | Freq: Two times a day (BID) | ORAL | Status: DC

## 2014-03-03 MED ORDER — PROCHLORPERAZINE EDISYLATE 5 MG/ML IJ SOLN
10.0000 mg | Freq: Once | INTRAMUSCULAR | Status: AC
  Administered 2014-03-03: 10 mg via INTRAVENOUS

## 2014-03-03 MED ORDER — AMITRIPTYLINE HCL 25 MG PO TABS: 100.0000 mg | ORAL_TABLET | Freq: Every day | ORAL | Status: DC

## 2014-03-03 MED ORDER — VALPROATE SODIUM 500 MG/5ML IV SOLN
1000.0000 mg | INTRAVENOUS | Status: DC
  Administered 2014-03-03 – 2014-03-08 (×2): 1000 mg via INTRAVENOUS

## 2014-03-03 MED ORDER — KETOROLAC TROMETHAMINE 30 MG/ML IJ SOLN
30.0000 mg | Freq: Once | INTRAMUSCULAR | Status: AC
  Administered 2014-03-03: 30 mg via INTRAMUSCULAR

## 2014-03-03 NOTE — Telephone Encounter (Signed)
Pt calling and has had migraine (now frontal - level 6-8).  Comes in to day at 1530 for MRI.  I relayed that she will need to come in earlier if order for infusion per Dr. Jaynee Eagles.  She has been taking topamax 50mg  po qhs since Monday as well as valium tid.  [phone]

## 2014-03-03 NOTE — Telephone Encounter (Signed)
Have her come in before 3pm for a migraine cocktail. Thank you.

## 2014-03-03 NOTE — Patient Instructions (Signed)
Patient to return home and rest.

## 2014-03-03 NOTE — Progress Notes (Signed)
Patient here having an MRI and had called for migraine.  Order from Dr. Jaynee Eagles for Depacon 1000mg  IV, Toradol 30mg  IM and Compazine 10mg  IV push.  Patient to treatment room with husband.   Patient headache pain level 8.  IV attempted in right AC unsuccessful, 2nd attempt in left hand, good blood return, 24g angiocath.  Depacon 1000mg  in 100cc 0.9%NS started at 1457.  Upon completion pain level down to 3-4.  New bag of 0.9% NS hung to flush and Compazine 10mg  IV pushed.  IV discontinued and removed.  Dr. Jaynee Eagles came to speak to patient.  Patient escorted to exit with spouse in NAD.

## 2014-03-07 ENCOUNTER — Telehealth: Payer: Self-pay | Admitting: *Deleted

## 2014-03-07 NOTE — Telephone Encounter (Signed)
I called pt and relayed the message.  Pt will make decision to go to ED.   She will call back if needed.

## 2014-03-07 NOTE — Telephone Encounter (Signed)
Pt did come in, [place], [place] took care of pt.

## 2014-03-07 NOTE — Telephone Encounter (Signed)
Pt calling has had migraine constant since Saturday.  Level 8, pt crying.  Pain located front and R back of head.  Is taking topamax 50mg  po bid and elavil 25mg  po qhs.   Would let Dr. Jaynee Eagles know.

## 2014-03-07 NOTE — Telephone Encounter (Signed)
Please call patient and tell her to go to [facility] emergency room. Thanks.

## 2014-03-08 ENCOUNTER — Ambulatory Visit (INDEPENDENT_AMBULATORY_CARE_PROVIDER_SITE_OTHER): Payer: Managed Care, Other (non HMO) | Admitting: *Deleted

## 2014-03-08 ENCOUNTER — Other Ambulatory Visit: Payer: Self-pay | Admitting: Neurology

## 2014-03-08 ENCOUNTER — Telehealth: Payer: Self-pay | Admitting: Neurology

## 2014-03-08 ENCOUNTER — Encounter: Payer: Self-pay | Admitting: Neurology

## 2014-03-08 VITALS — BP 130/91 | HR 85 | Temp 98.2°F | Resp 16

## 2014-03-08 DIAGNOSIS — G43111 Migraine with aura, intractable, with status migrainosus: Secondary | ICD-10-CM

## 2014-03-08 DIAGNOSIS — R51 Headache: Secondary | ICD-10-CM

## 2014-03-08 MED ORDER — METHYLPREDNISOLONE 4 MG PO KIT: PACK | ORAL | Status: DC

## 2014-03-08 MED ORDER — PROCHLORPERAZINE EDISYLATE 5 MG/ML IJ SOLN
10.0000 mg | Freq: Once | INTRAMUSCULAR | Status: AC
Start: 2014-03-08 — End: 2014-03-08
  Administered 2014-03-08: 10 mg via INTRAVENOUS

## 2014-03-08 MED ORDER — SODIUM CHLORIDE 0.9 % IV SOLN
500.0000 mg | INTRAVENOUS | Status: DC
Start: 2014-03-08 — End: 2016-08-11
  Administered 2014-03-08: 500 mg via INTRAVENOUS

## 2014-03-08 MED ORDER — KETOROLAC TROMETHAMINE 30 MG/ML IJ SOLN
30.0000 mg | Freq: Once | INTRAMUSCULAR | Status: AC
  Administered 2014-03-08: 30 mg via INTRAVENOUS

## 2014-03-08 MED ORDER — METHYLPREDNISOLONE SODIUM SUCC 500 MG IJ SOLR
250.0000 mg | INTRAMUSCULAR | Status: DC
Start: 2014-03-08 — End: 2016-08-11
  Administered 2014-03-08: 250 mg via INTRAVENOUS

## 2014-03-08 NOTE — Telephone Encounter (Signed)
Pt calling did not go to ER yesterday.  Still with headache.  Taking topamax 50 mg po bid and elavil 50mg  po qhs now.  Missing work today.   I gave her the MRI results.  (normal, other then mucous cyst L cheek sinus).  Asking if anything else? Please call [phone]

## 2014-03-08 NOTE — Progress Notes (Signed)
Pt here for infusion.  Husband with her.  Accompanied to treatment room.  Made comfortable.  Plan of care explained.  Under aseptic technique IV 24g angiocath inserted to R AC with good blood return.  Taped securely.  Level 8 pain scale.  L eye, side and back of head pain.  At 1413 Depacon 1000mg  IV started.  1423 level 6.  At 1426 level to 5-6, another Depacon 500mg  IV started.  At 1440 level 3-4, solumedrol 250mg  IV started.  VS 125/87, 80 HR, 98 % O2 sat.  1456 normal saline flush, level 2-3.  Toradol 30mg  IVP at 1459, then Compazine 10mg  IVP at 1503.  At 1510 68ml normal saline flush , then 1517 IV discontinued, level 2.  Dr. Jaynee Eagles in with pt.  Medrol dose pack ordered.  Note given for out of work all week.  116/84, 86 HR, 96% pulse ox.  Pt accompanied to check out with husband.

## 2014-03-08 NOTE — Telephone Encounter (Signed)
Patient calling again and has decided to come in today for an infusion, please return her call at [phone].

## 2014-03-08 NOTE — Telephone Encounter (Signed)
Pressure in the back of head and front, nausea, has sound sensitivy and light sensitivity. Can't focus at work. Having side effects from topamax so will increase the amitriptyline,

## 2014-03-08 NOTE — Patient Instructions (Signed)
Pt to home with husband.

## 2014-03-08 NOTE — Telephone Encounter (Signed)
Patient calling for MRI results, requesting to check for Chiari Malformation.  Please call and advise.

## 2014-03-08 NOTE — Telephone Encounter (Signed)
Patient coming today for Depacon 1000mg  IV may give an additional 500mg , Solumedrol 250mg  IV Compazine 10mg , and Toradol 30mg  IV push.

## 2014-03-09 ENCOUNTER — Telehealth: Payer: Self-pay | Admitting: Neurology

## 2014-03-09 NOTE — Telephone Encounter (Signed)
SPoke to pateint regarding her MRI results.

## 2014-03-10 ENCOUNTER — Ambulatory Visit (INDEPENDENT_AMBULATORY_CARE_PROVIDER_SITE_OTHER): Payer: Managed Care, Other (non HMO)

## 2014-03-10 DIAGNOSIS — G43111 Migraine with aura, intractable, with status migrainosus: Secondary | ICD-10-CM

## 2014-03-14 ENCOUNTER — Telehealth: Payer: Self-pay | Admitting: Neurology

## 2014-03-14 NOTE — Telephone Encounter (Signed)
Please review MRI/MRA so that patient can be given results.

## 2014-03-14 NOTE — Telephone Encounter (Signed)
Patient requesting MRV and MRA results since she received a call from her PCP telling her that her white blood cell count was very high. Patient states she's still having headaches. Please call and advise.

## 2014-03-14 NOTE — Telephone Encounter (Signed)
Patient requesting MRV and MRA results.  Please call anytime and may leave detailed message on voicemail.

## 2014-03-14 NOTE — Telephone Encounter (Signed)
Called and gave patient results.

## 2014-03-15 ENCOUNTER — Encounter: Payer: Self-pay | Admitting: Neurology

## 2014-03-16 ENCOUNTER — Other Ambulatory Visit: Payer: Self-pay | Admitting: Neurology

## 2014-03-16 DIAGNOSIS — G4452 New daily persistent headache (NDPH): Secondary | ICD-10-CM

## 2014-03-16 DIAGNOSIS — G4459 Other complicated headache syndrome: Secondary | ICD-10-CM

## 2014-03-16 MED ORDER — DIVALPROEX SODIUM 500 MG PO DR TAB: 500.0000 mg | DELAYED_RELEASE_TABLET | Freq: Every day | ORAL | Status: DC

## 2014-03-18 ENCOUNTER — Other Ambulatory Visit: Payer: Self-pay | Admitting: Neurology

## 2014-03-18 ENCOUNTER — Encounter: Payer: Self-pay | Admitting: Neurology

## 2014-03-18 DIAGNOSIS — G4452 New daily persistent headache (NDPH): Secondary | ICD-10-CM

## 2014-03-18 NOTE — Telephone Encounter (Signed)
Called patient. Informed faxed LP referral to [facility].

## 2014-03-21 ENCOUNTER — Telehealth: Payer: Self-pay | Admitting: Neurology

## 2014-03-21 NOTE — Telephone Encounter (Signed)
She doesn't need the LP at [place] imaging if she had it done at [facility]. Thanks

## 2014-03-21 NOTE — Telephone Encounter (Signed)
See phone note

## 2014-03-21 NOTE — Telephone Encounter (Signed)
Patient stated went to [facility] Emergency Department on Friday.  They performed Lumbar Puncture and the pressure was 10.  Please call and advise.

## 2014-03-21 NOTE — Telephone Encounter (Signed)
Dr. Cathren Laine message given to patient in regards to LP. Explained to patient that she will need to come by the office and sign a records release so that Dr. Jaynee Eagles can get results of LP. She was told to schedule a follow-up with-in 3 days. Patient will come by tomorrow to sign release.

## 2014-03-22 ENCOUNTER — Other Ambulatory Visit: Payer: Self-pay | Admitting: Neurology

## 2014-03-22 ENCOUNTER — Telehealth: Payer: Self-pay | Admitting: *Deleted

## 2014-03-22 DIAGNOSIS — M542 Cervicalgia: Secondary | ICD-10-CM

## 2014-03-22 MED ORDER — VENLAFAXINE HCL 37.5 MG PO TABS: 37.5000 mg | ORAL_TABLET | Freq: Two times a day (BID) | ORAL | Status: DC

## 2014-03-22 MED ORDER — DIAZEPAM 10 MG PO TABS: 10.0000 mg | ORAL_TABLET | Freq: Four times a day (QID) | ORAL | Status: DC | PRN

## 2014-03-22 NOTE — Telephone Encounter (Signed)
Release fax over to [facility] Judson Roch office requesting records 03/22/14.

## 2014-03-24 ENCOUNTER — Telehealth: Payer: Self-pay | Admitting: *Deleted

## 2014-03-24 NOTE — Telephone Encounter (Signed)
Records receive on 03/24/14.

## 2014-03-25 ENCOUNTER — Ambulatory Visit: Payer: Private Health Insurance - Indemnity | Attending: Neurology | Admitting: Physical Therapy

## 2014-03-25 DIAGNOSIS — M542 Cervicalgia: Secondary | ICD-10-CM | POA: Diagnosis present

## 2014-03-29 ENCOUNTER — Other Ambulatory Visit: Payer: Self-pay | Admitting: Neurology

## 2014-03-29 MED ORDER — VENLAFAXINE HCL ER 75 MG PO CP24: 75.0000 mg | ORAL_CAPSULE | Freq: Every day | ORAL | Status: DC

## 2014-03-31 ENCOUNTER — Ambulatory Visit: Payer: Private Health Insurance - Indemnity

## 2014-03-31 DIAGNOSIS — M542 Cervicalgia: Secondary | ICD-10-CM | POA: Diagnosis not present

## 2014-04-05 ENCOUNTER — Ambulatory Visit: Payer: Private Health Insurance - Indemnity | Admitting: Physical Therapy

## 2014-04-05 DIAGNOSIS — M542 Cervicalgia: Secondary | ICD-10-CM | POA: Diagnosis not present

## 2014-04-07 ENCOUNTER — Encounter: Payer: Private Health Insurance - Indemnity | Admitting: Rehabilitation

## 2014-04-11 ENCOUNTER — Encounter: Payer: Self-pay | Admitting: Neurology

## 2014-05-25 ENCOUNTER — Ambulatory Visit: Payer: Managed Care, Other (non HMO) | Admitting: Neurology

## 2014-05-27 ENCOUNTER — Encounter: Payer: Self-pay | Admitting: Neurology

## 2014-07-18 IMAGING — CT CT ABD-PELV W/ CM
2 of 4 series · 16 of 46 positions shown, 18 images · IV contrast (Omnipaque 300)
Comparison: Pelvic ultrasound performed 03/12/2012

CLINICAL DATA: Epigastric abdominal pain and heartburn.

EXAM:
CT ABDOMEN AND PELVIS WITH CONTRAST
TECHNIQUE: Multidetector CT imaging of the abdomen and pelvis was performed
using the standard protocol following bolus administration of
intravenous contrast.
CONTRAST:  50mL OMNIPAQUE IOHEXOL 300 MG/ML SOLN, 100mL OMNIPAQUE
IOHEXOL 300 MG/ML SOLN

[Series 2: abd_pel_with 5.0 b40f · axial · 0.83mm/px · z∈[-509,-59]mm · 13 of 98 slices shown, 15 images]
[im 4/98  soft-tissue]
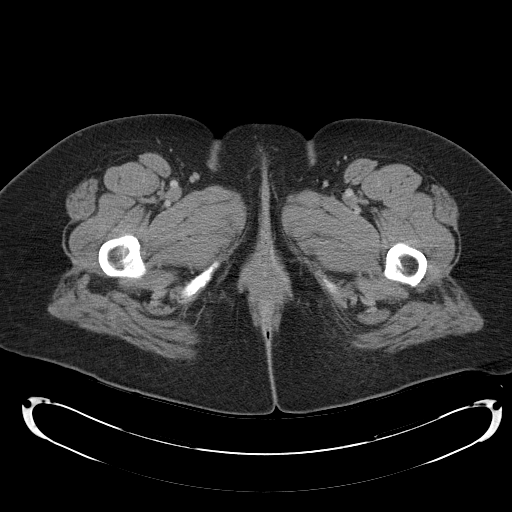
[im 4/98  bone]
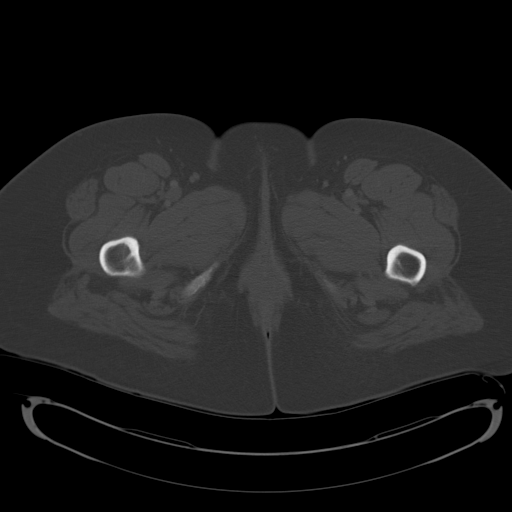
[im 12/98  soft-tissue]
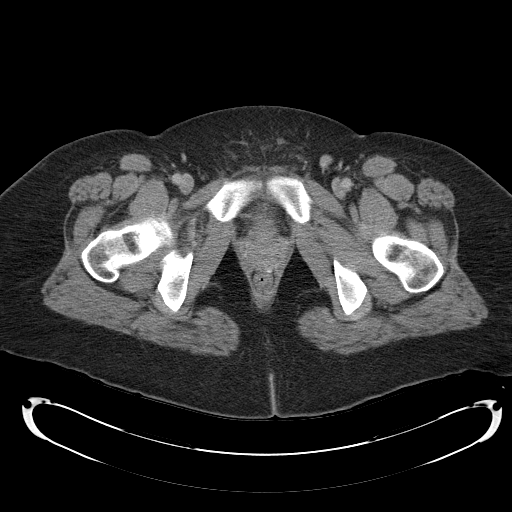
[im 19/98  soft-tissue]
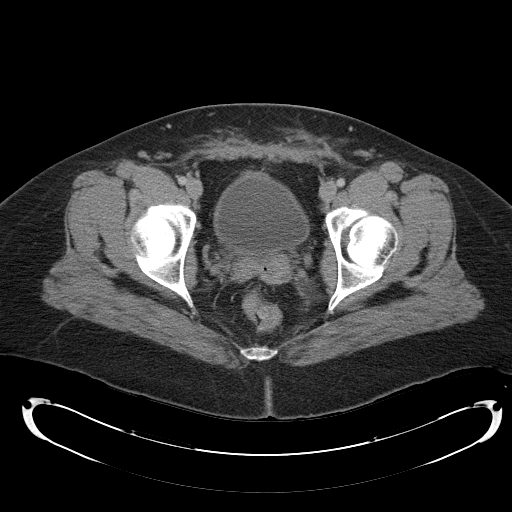
[im 27/98  soft-tissue]
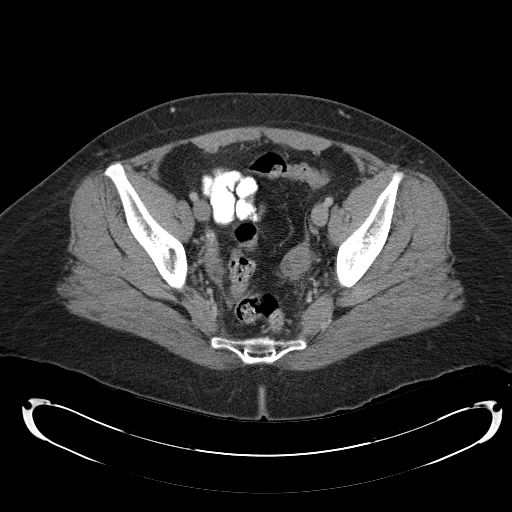
[im 34/98  soft-tissue]
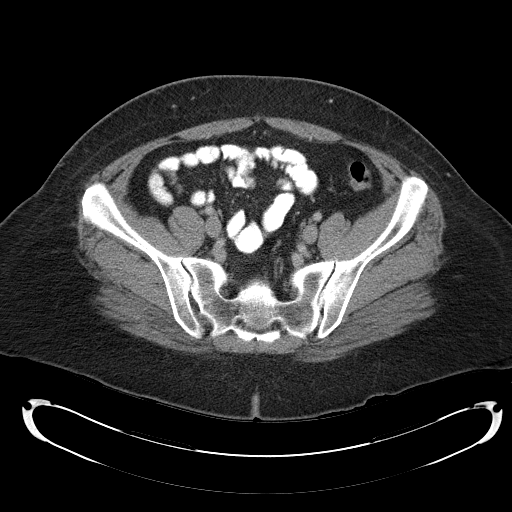
[im 42/98  soft-tissue]
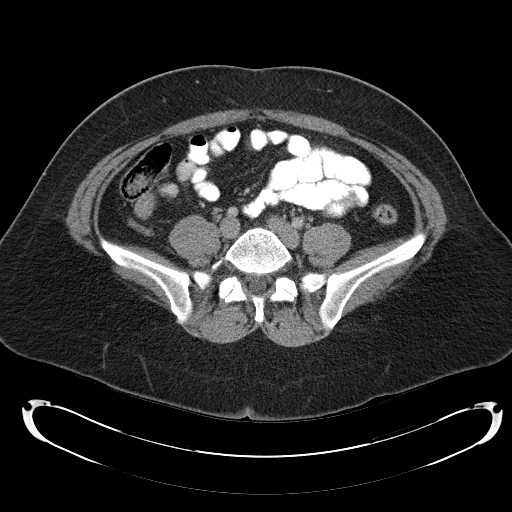
[im 49/98  soft-tissue]
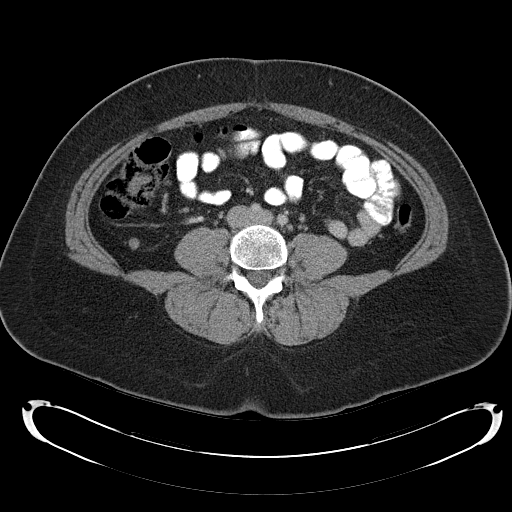
[im 56/98  soft-tissue]
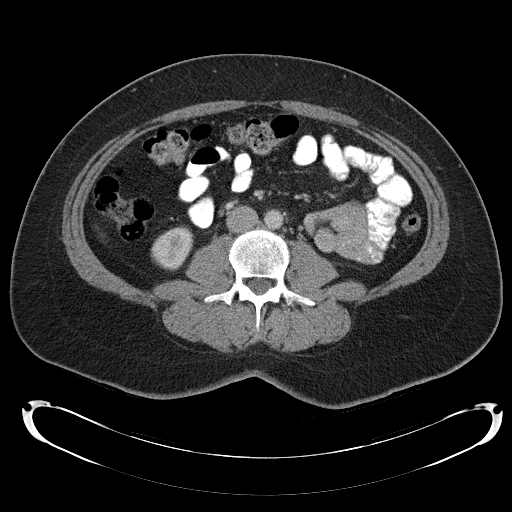
[im 64/98  soft-tissue]
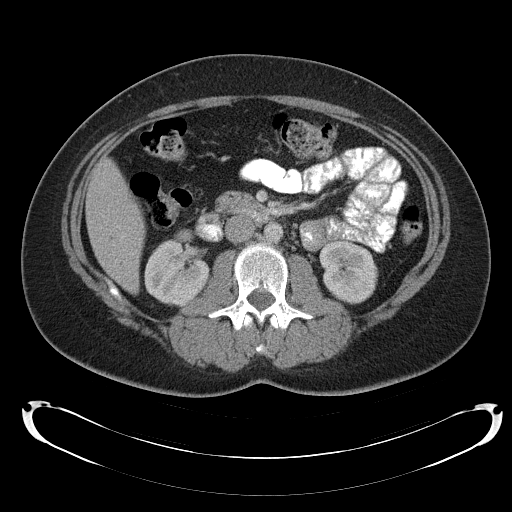
[im 64/98  bone]
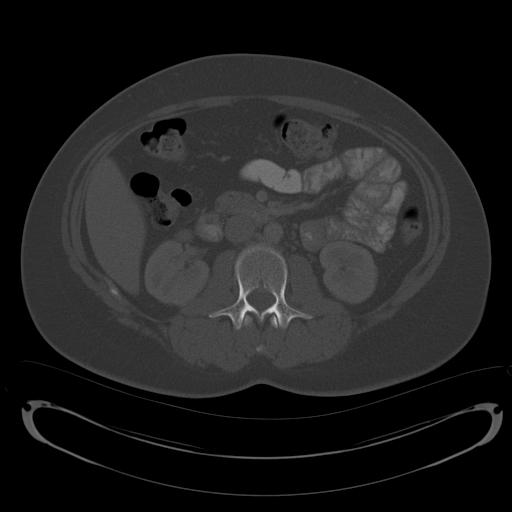
[im 71/98  soft-tissue]
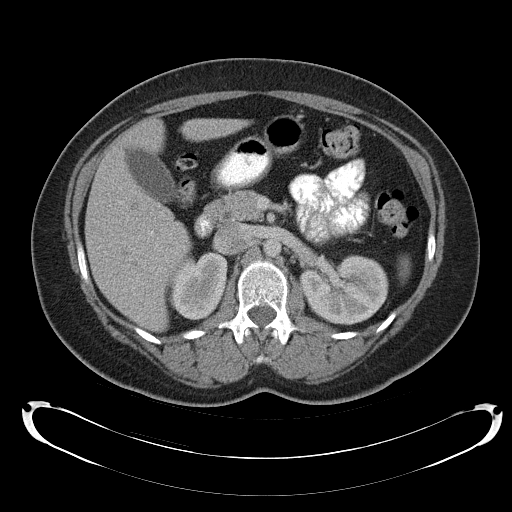
[im 79/98  soft-tissue]
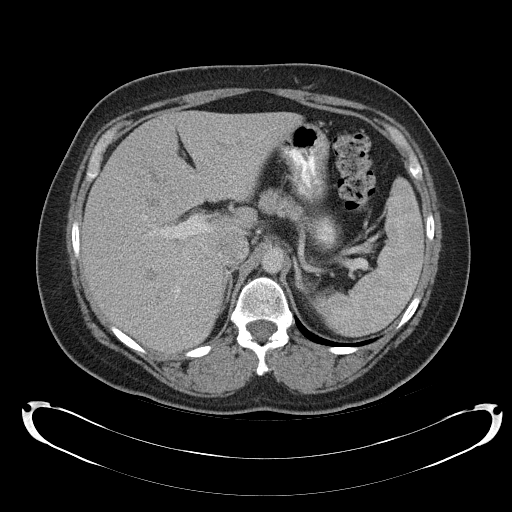
[im 86/98  soft-tissue]
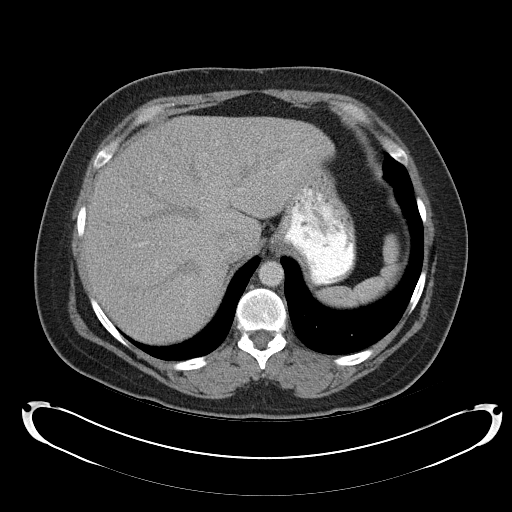
[im 94/98  soft-tissue]
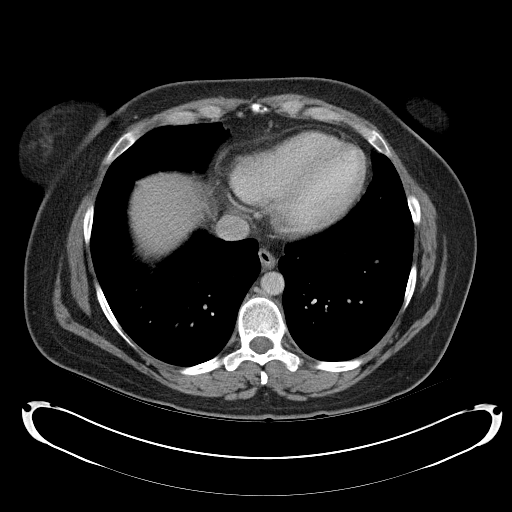

[Series 4: abd_pel_with 3.0 spo cor · coronal · 0.81mm/px · 3 of 90 slices shown]
[im 30/90  soft-tissue]
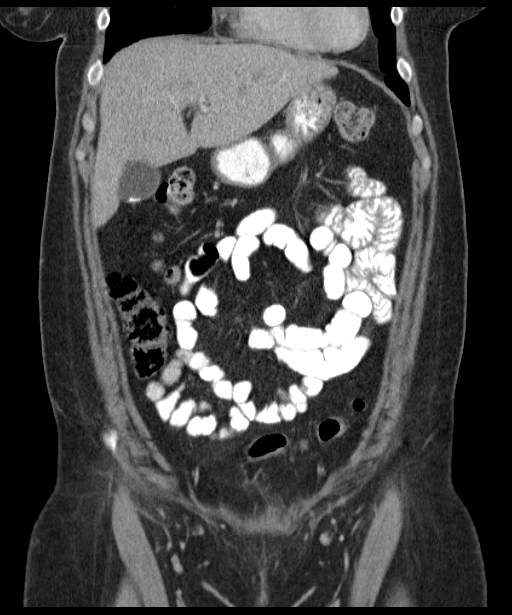
[im 40/90  soft-tissue]
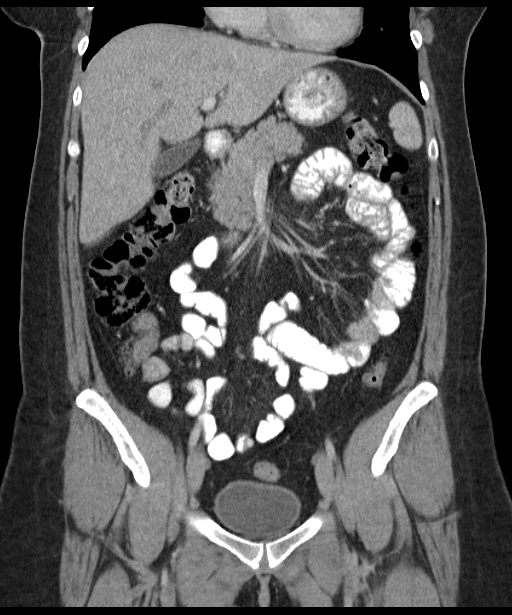
[im 50/90  soft-tissue]
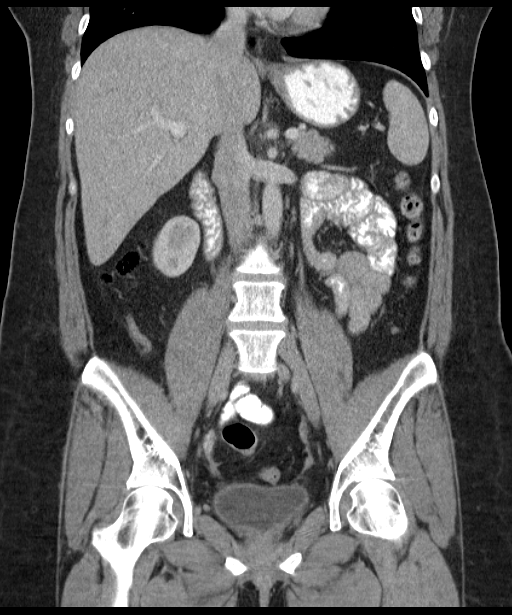

[16 of 46 positions shown; findings below may reference images not displayed]

FINDINGS: The visualized lung bases are clear. There is no evidence of a
hiatal hernia.

The liver and spleen are unremarkable in appearance. A stone is
noted at the fundus of the gallbladder; the gallbladder is otherwise
unremarkable in appearance. The pancreas and adrenal glands are
unremarkable.

The kidneys are unremarkable in appearance. There is no evidence of
hydronephrosis. No renal or ureteral stones are seen. No perinephric
stranding is appreciated.

No free fluid is identified. The small bowel is unremarkable in
appearance. The stomach is within normal limits. No acute vascular
abnormalities are seen.

The appendix is normal in caliber, without evidence for
appendicitis. The colon is largely decompressed and is unremarkable
in appearance.

The bladder is mildly distended and grossly unremarkable. The
patient is status post hysterectomy. There is mild soft tissue
inflammation about the vaginal cuff and ovaries, of uncertain
significance. This may be postoperative in nature. No suspicious
adnexal masses are seen. Mild postoperative change is noted along
the anterior pelvic wall. No inguinal lymphadenopathy is seen.

No acute osseous abnormalities are identified.
IMPRESSION: 1. No acute abnormality seen to explain the patient's symptoms.
2. Cholelithiasis noted; gallbladder otherwise unremarkable in
appearance.
3. Mild soft tissue inflammation about the vaginal cuff and ovaries,
of uncertain significance. This may simply be postoperative in
nature. Would correlate for associated symptoms. No suspicious
adnexal masses seen.

## 2014-11-08 ENCOUNTER — Encounter: Payer: Self-pay | Admitting: Neurology

## 2014-11-08 ENCOUNTER — Telehealth: Payer: Managed Care, Other (non HMO) | Admitting: Nurse Practitioner

## 2014-11-08 DIAGNOSIS — M545 Low back pain: Secondary | ICD-10-CM

## 2014-11-08 DIAGNOSIS — M546 Pain in thoracic spine: Secondary | ICD-10-CM

## 2014-11-08 NOTE — Progress Notes (Signed)
Based on what you shared with me it looks like you have a serious condition that should be evaluated in a face to face office visit.  If you are having a true medical emergency please call 911.  If you need an urgent face to face visit, [facility] has four urgent care centers for your convenience.  . [facility] a Provider at this Location  [address] [place], [place] [address] . 8 am to 8 pm Monday-Friday . 9 am to 7 pm Saturday-Sunday  . [facility] [place] a Provider at this Location  [address] [place], [place], [place] [address] . 8 am to 8 pm Monday-Friday . 9 am to 6 pm Saturday . 11 am to 6 pm Sunday   . [facility] at [place] Get Driving Directions  [address] [place], [place] [address] . 8 am to 8 pm Monday-Friday . 9 am to 4 pm Saturday-Sunday   . [facility] (a walk in primary care provider)  [place] a Provider at this Location  [place], [place] [address] . 8 am to 8:30 pm Monday-Thursday . 8 am to 6 pm Friday . 8 am to 4 pm Saturday-Sunday   Your e-visit answers were reviewed by a board certified advanced clinical practitioner to complete your personal care plan.  Depending on the condition, your plan could have included both over the counter or prescription medications.  You will get an e-mail in the next two days asking about your experience.  I hope that your e-visit has been valuable and will speed your recovery . Thank you for choosing an e-visit.

## 2014-11-11 ENCOUNTER — Encounter: Payer: Self-pay | Admitting: *Deleted

## 2014-11-11 ENCOUNTER — Telehealth: Payer: Self-pay | Admitting: *Deleted

## 2014-11-11 NOTE — Telephone Encounter (Signed)
Number is no longer in service, unable to reach pt to schedule appt for husband.

## 2014-11-11 NOTE — Telephone Encounter (Signed)
Error

## 2014-11-11 NOTE — Telephone Encounter (Signed)
[employer]ate [employer]. I do not have an extension number and no option for pt name. Unable to contact pt with this number.

## 2014-11-11 NOTE — Telephone Encounter (Signed)
Number is not in service, unable to reach pt.

## 2014-11-11 NOTE — Telephone Encounter (Signed)
Number is no longer in service, unable to reach husband to schedule appt with Dr. Jaynee Eagles.

## 2014-12-23 ENCOUNTER — Encounter: Payer: Self-pay | Admitting: Neurology

## 2015-07-02 ENCOUNTER — Emergency Department (HOSPITAL_COMMUNITY): Payer: Managed Care, Other (non HMO)

## 2015-07-02 ENCOUNTER — Emergency Department (HOSPITAL_COMMUNITY)
Admission: EM | Admit: 2015-07-02 | Discharge: 2015-07-02 | Disposition: A | Discharge status: Home / Self Care | Payer: Managed Care, Other (non HMO) | Attending: Emergency Medicine | Admitting: Emergency Medicine

## 2015-07-02 ENCOUNTER — Encounter (HOSPITAL_COMMUNITY): Payer: Self-pay | Admitting: Emergency Medicine

## 2015-07-02 DIAGNOSIS — Z8719 Personal history of other diseases of the digestive system: Secondary | ICD-10-CM | POA: Diagnosis not present

## 2015-07-02 DIAGNOSIS — R630 Anorexia: Secondary | ICD-10-CM | POA: Diagnosis not present

## 2015-07-02 DIAGNOSIS — Z87442 Personal history of urinary calculi: Secondary | ICD-10-CM | POA: Diagnosis not present

## 2015-07-02 DIAGNOSIS — R197 Diarrhea, unspecified: Secondary | ICD-10-CM | POA: Insufficient documentation

## 2015-07-02 DIAGNOSIS — R1031 Right lower quadrant pain: Secondary | ICD-10-CM | POA: Insufficient documentation

## 2015-07-02 DIAGNOSIS — Z79899 Other long term (current) drug therapy: Secondary | ICD-10-CM | POA: Diagnosis not present

## 2015-07-02 DIAGNOSIS — Z90710 Acquired absence of both cervix and uterus: Secondary | ICD-10-CM | POA: Insufficient documentation

## 2015-07-02 DIAGNOSIS — E039 Hypothyroidism, unspecified: Secondary | ICD-10-CM | POA: Insufficient documentation

## 2015-07-02 DIAGNOSIS — Z87891 Personal history of nicotine dependence: Secondary | ICD-10-CM | POA: Diagnosis not present

## 2015-07-02 DIAGNOSIS — R350 Frequency of micturition: Secondary | ICD-10-CM | POA: Insufficient documentation

## 2015-07-02 DIAGNOSIS — R110 Nausea: Secondary | ICD-10-CM | POA: Diagnosis not present

## 2015-07-02 DIAGNOSIS — Z7952 Long term (current) use of systemic steroids: Secondary | ICD-10-CM | POA: Diagnosis not present

## 2015-07-02 DIAGNOSIS — Z791 Long term (current) use of non-steroidal anti-inflammatories (NSAID): Secondary | ICD-10-CM | POA: Diagnosis not present

## 2015-07-02 DIAGNOSIS — Z9049 Acquired absence of other specified parts of digestive tract: Secondary | ICD-10-CM | POA: Diagnosis not present

## 2015-07-02 DIAGNOSIS — Z8619 Personal history of other infectious and parasitic diseases: Secondary | ICD-10-CM | POA: Insufficient documentation

## 2015-07-02 LAB — URINALYSIS, ROUTINE W REFLEX MICROSCOPIC
BILIRUBIN URINE: NEGATIVE
Glucose, UA: NEGATIVE mg/dL
HGB URINE DIPSTICK: NEGATIVE
Ketones, ur: NEGATIVE mg/dL
Leukocytes, UA: NEGATIVE
NITRITE: NEGATIVE
PROTEIN: NEGATIVE mg/dL
Specific Gravity, Urine: 1.016 (ref 1.005–1.030)
pH: 6 (ref 5.0–8.0)

## 2015-07-02 LAB — CBC
HCT: 39.7 % (ref 36.0–46.0)
HEMOGLOBIN: 13.1 g/dL (ref 12.0–15.0)
MCH: 30 pg (ref 26.0–34.0)
MCHC: 33 g/dL (ref 30.0–36.0)
MCV: 91.1 fL (ref 78.0–100.0)
PLATELETS: 277 10*3/uL (ref 150–400)
RBC: 4.36 MIL/uL (ref 3.87–5.11)
RDW: 13.8 % (ref 11.5–15.5)
WBC: 7.8 10*3/uL (ref 4.0–10.5)

## 2015-07-02 LAB — COMPREHENSIVE METABOLIC PANEL
ALK PHOS: 77 U/L (ref 38–126)
ALT: 38 U/L (ref 14–54)
ANION GAP: 8 (ref 5–15)
AST: 27 U/L (ref 15–41)
Albumin: 3.7 g/dL (ref 3.5–5.0)
BILIRUBIN TOTAL: 0.4 mg/dL (ref 0.3–1.2)
BUN: 11 mg/dL (ref 6–20)
CO2: 25 mmol/L (ref 22–32)
CREATININE: 0.71 mg/dL (ref 0.44–1.00)
Calcium: 9.3 mg/dL (ref 8.9–10.3)
Chloride: 107 mmol/L (ref 101–111)
GFR calc non Af Amer: >60 mL/min (ref >60)
Glucose, Bld: 104 mg/dL — ABNORMAL HIGH (ref 65–99)
Potassium: 4.2 mmol/L (ref 3.5–5.1)
SODIUM: 140 mmol/L (ref 135–145)
TOTAL PROTEIN: 7.5 g/dL (ref 6.5–8.1)

## 2015-07-02 LAB — LIPASE, BLOOD: Lipase: 33 U/L (ref 11–51)

## 2015-07-02 MED ORDER — ONDANSETRON HCL 4 MG/2ML IJ SOLN
4.0000 mg | Freq: Once | INTRAMUSCULAR | Status: AC
  Administered 2015-07-02: 4 mg via INTRAVENOUS
  Filled 2015-07-02: qty 2

## 2015-07-02 MED ORDER — IOHEXOL 300 MG/ML  SOLN
80.0000 mL | Freq: Once | INTRAMUSCULAR | Status: AC | PRN
  Administered 2015-07-02: 100 mL via INTRAVENOUS

## 2015-07-02 MED ORDER — HYDROMORPHONE HCL 1 MG/ML IJ SOLN
1.0000 mg | Freq: Once | INTRAMUSCULAR | Status: AC
  Administered 2015-07-02: 1 mg via INTRAVENOUS
  Filled 2015-07-02: qty 1

## 2015-07-02 NOTE — Discharge Instructions (Signed)
Abdominal Pain, Adult Follow-up with her primary care physician. Take Tylenol or Motrin for pain. Return for increased abdominal pain, vomiting, or fever. Many things can cause abdominal pain. Usually, abdominal pain is not caused by a disease and will improve without treatment. It can often be observed and treated at home. Your health care provider will do a physical exam and possibly order blood tests and X-rays to help determine the seriousness of your pain. However, in many cases, more time must pass before a clear cause of the pain can be found. Before that point, your health care provider may not know if you need more testing or further treatment. HOME CARE INSTRUCTIONS Monitor your abdominal pain for any changes. The following actions may help to alleviate any discomfort you are experiencing:  Only take over-the-counter or prescription medicines as directed by your health care provider.  Do not take laxatives unless directed to do so by your health care provider.  Try a clear liquid diet (broth, tea, or water) as directed by your health care provider. Slowly move to a bland diet as tolerated. SEEK MEDICAL CARE IF:  You have unexplained abdominal pain.  You have abdominal pain associated with nausea or diarrhea.  You have pain when you urinate or have a bowel movement.  You experience abdominal pain that wakes you in the night.  You have abdominal pain that is worsened or improved by eating food.  You have abdominal pain that is worsened with eating fatty foods.  You have a fever. SEEK IMMEDIATE MEDICAL CARE IF:  Your pain does not go away within 2 hours.  You keep throwing up (vomiting).  Your pain is felt only in portions of the abdomen, such as the right side or the left lower portion of the abdomen.  You pass bloody or black tarry stools. MAKE SURE YOU:  Understand these instructions.  Will watch your condition.  Will get help right away if you are not doing well or  get worse.   This information is not intended to replace advice given to you by your health care provider. Make sure you discuss any questions you have with your health care provider.   Document Released: 03/06/2005 Document Revised: 02/15/2015 Document Reviewed: 02/03/2013 Elsevier Interactive Patient Education [employer].

## 2015-07-02 NOTE — ED Provider Notes (Signed)
CSN: [id number]     Arrival date & time 07/02/15  1407 History   First MD Initiated Contact with Patient 07/02/15 1802     Chief Complaint  Patient presents with  . Abdominal Pain   (Consider location/radiation/quality/duration/timing/severity/associated sxs/prior Treatment) Patient is Catherine 40 y.o. [name] presenting with abdominal pain. The history is provided by the patient. No language interpreter was used.  Abdominal Pain Associated symptoms: diarrhea and nausea   Associated symptoms: no chills, no constipation, no fever and no vomiting    Catherine [name] is Catherine 40 year old [name] with Catherine history of hypothyroidism, GERD, cholecystectomy, bilateral salpingectomy and hysterectomy, and kidney stones presents with sudden onset constant right-sided abdominal pain that began on the right of her umbilicus last night. Catherine [name] reports that it has since moved to the right lower quadrant and feels like Catherine shooting pain. Catherine [name] also mentioned that Catherine [name] had some diarrhea this morning but without blood. Catherine [name] reports Catherine decreased appetite with mild nausea and urinary frequency today. Took Tylenol this morning with little relief. Nizatidine fever, chills, vomiting, constipation, vaginal discharge or bleeding, vaginal odor or itching, hematuria, or dysuria. Status post hysterectomy. Last by mouth intake was at 12:00 noon. Catherine [name] denies any fall or injury.   Past Medical History  Diagnosis Date  . Hypothyroidism   . GERD (gastroesophageal reflux disease)   . FHx: allergies   . Kidney stones   . Allergic rhinitis   . H/O scarlet fever    Past Surgical History  Procedure Laterality Date  . Bladder surgery      reports had bladder stretched as child  . Wisdom tooth extraction    . Abdominal hysterectomy N/Catherine 10/06/2013    Procedure: HYSTERECTOMY ABDOMINAL;  Surgeon: Florian Buff, MD;  Location: [facility];  Service: Gynecology;  Laterality: N/Catherine;  . Bilateral salpingectomy Bilateral 10/06/2013    Procedure: BILATERAL  SALPINGECTOMY;  Surgeon: Florian Buff, MD;  Location: [facility];  Service: Gynecology;  Laterality: Bilateral;  . Cholecystectomy N/Catherine 11/26/2013    Procedure: LAPAROSCOPIC CHOLECYSTECTOMY;  Surgeon: Scherry Ran, MD;  Location: [facility];  Service: General;  Laterality: N/Catherine;   Family History  Problem Relation Age of Onset  . Heart disease Father   . Hypertension Father   . Cancer Maternal Aunt   . Thyroid disease Maternal Aunt   . Cancer Paternal Aunt   . Thyroid disease Maternal Grandmother   . Cancer Maternal Grandmother   . Diabetes Maternal Grandfather   . Hypertension Maternal Grandfather   . Migraines Neg Hx    Social History  Substance Use Topics  . Smoking status: Former Smoker -- 0.50 packs/day for 19 years    Types: Cigarettes    Quit date: 10/08/2011  . Smokeless tobacco: Never Used  . Alcohol Use: 0.0 oz/week     Comment: very rarley   OB History    Gravida Para Term Preterm AB TAB SAB Ectopic Multiple Living   1 0 0 0 1 0 1 0 0 0      Review of Systems  Constitutional: Negative for fever and chills.  Gastrointestinal: Positive for nausea, abdominal pain and diarrhea. Negative for vomiting and constipation.  All other systems reviewed and are negative.     Allergies  Morphine and related and Sulfa antibiotics  Home Medications   Prior to Admission medications   Medication Sig Start Date End Date Taking? Authorizing Provider  amitriptyline (ELAVIL) 25 MG tablet Take 4 tablets (100 mg total) by mouth at bedtime.  Start with one tab at bedtime. Every 3-4 days can add Catherine pill at night until taking 4 pills before bed as tolerated and needed. 03/03/14   [name], MD  celecoxib (CELEBREX) 200 MG capsule Take 1 capsule (200 mg total) by mouth 2 (two) times daily. 02/22/14   Lily Kocher, PA-C  dexamethasone (DECADRON) 4 MG tablet Take 3 tablets on day one. Take 2 tablets on day 2. Take 1 tablet on day 3. 02/23/14   [name], MD  diazepam (VALIUM) 10 MG  tablet Take 1 tablet (10 mg total) by mouth every 6 (six) hours as needed for anxiety. 03/22/14   [name], MD  divalproex (DEPAKOTE) 500 MG DR tablet Take 1 tablet (500 mg total) by mouth daily. 03/16/14   [name], MD  levothyroxine (SYNTHROID, LEVOTHROID) 112 MCG tablet Take 112 mcg by mouth daily before breakfast.    Historical Provider, MD  methylPREDNISolone (MEDROL DOSEPAK) 4 MG tablet follow package directions 03/08/14   [name], MD  rizatriptan (MAXALT-MLT) 10 MG disintegrating tablet  02/18/14   Historical Provider, MD  SUMAtriptan (IMITREX) 100 MG tablet  02/17/14   Historical Provider, MD  topiramate (TOPAMAX) 50 MG tablet Take 1 tablet (50 mg total) by mouth 2 (two) times daily. 03/03/14   [name], MD  venlafaxine XR (EFFEXOR XR) 75 MG 24 hr capsule Take 1 capsule (75 mg total) by mouth daily with breakfast. 03/29/14   [name], MD   BP 116/77 mmHg  Pulse 71  Temp(Src) 98 F (36.7 C) (Oral)  Resp 16  Ht 5\' 9"  (1.753 m)  Wt 116.574 kg  BMI 37.93 kg/m2  SpO2 99%  LMP 09/18/2013 Physical Exam  Constitutional: Catherine [name] is oriented to person, place, and time. Catherine [name] appears well-developed and well-nourished.  HENT:  Head: Normocephalic and atraumatic.  Eyes: Conjunctivae are normal.  Neck: Normal range of motion. Neck supple.  Cardiovascular: Normal rate, regular rhythm and normal heart sounds.   Pulmonary/Chest: Effort normal and breath sounds normal.  Abdominal: Soft. There is tenderness. There is guarding. There is no rebound and no CVA tenderness.    Obese. Right lower quadrant abdominal tenderness to palpation as diagrammed. Guarding. Positive obturator sign. Negative heel tap. No rebound. No CVA tenderness. No abdominal distention.  Musculoskeletal: Normal range of motion.  Neurological: Catherine [name] is alert and oriented to person, place, and time.  Skin: Skin is warm and dry.  No rash on the abdomen.  Nursing note and vitals reviewed.   ED Course    Procedures (including critical care time) Labs Review Labs Reviewed  COMPREHENSIVE METABOLIC PANEL - Abnormal; Notable for the following:    Glucose, Bld 104 (*)    All other components within normal limits  URINALYSIS, ROUTINE W REFLEX MICROSCOPIC (NOT AT [facility]) - Abnormal; Notable for the following:    APPearance CLOUDY (*)    All other components within normal limits  URINE CULTURE  LIPASE, BLOOD  CBC    Imaging Review Ct Abdomen Pelvis W Contrast  07/02/2015  CLINICAL DATA:  Right-sided abdominal pain since last evening. Diarrhea this morning. Decreased appetite. EXAM: CT ABDOMEN AND PELVIS WITH CONTRAST TECHNIQUE: Multidetector CT imaging of the abdomen and pelvis was performed using the standard protocol following bolus administration of intravenous contrast. CONTRAST:  158mL OMNIPAQUE IOHEXOL 300 MG/ML  SOLN COMPARISON:  11/22/2013 FINDINGS: Lower chest: The lung bases are clear of acute process. No pleural effusion or pulmonary lesions. The heart  is normal in size. No pericardial effusion. The distal esophagus and aorta are unremarkable. Small hiatal hernia. Hepatobiliary: No focal hepatic lesions or intrahepatic biliary dilatation. The gallbladder is surgically absent. No common bile duct dilatation. Pancreas: No mass, inflammation or ductal dilatation. Spleen: Normal size.  No focal lesions. Adrenals/Urinary Tract: The adrenal glands and kidneys are normal. No renal or obstructing ureteral calculi or bladder calculi. Stomach/Bowel: The stomach, duodenum, small bowel and colon are unremarkable. No inflammatory changes, mass lesions or obstructive findings. The terminal ileum is normal. The appendix is normal. Vascular/Lymphatic: No mesenteric or retroperitoneal mass or adenopathy. Small scattered lymph nodes are noted. The aorta and branch vessels are normal. The major venous structures are patent. Reproductive: The uterus is surgically absent. Both ovaries are still present and appear  normal. Other: No pelvic mass or adenopathy. No free pelvic fluid collections. No inguinal mass or adenopathy. Musculoskeletal: No significant bony findings. IMPRESSION: 1. No acute abdominal/pelvic findings, mass lesions or lymphadenopathy. 2. Small hiatal hernia. 3. Status post hysterectomy.  Both ovaries appear normal. Electronically Signed   By: Marijo Sanes M.D.   On: 07/02/2015 19:48   I have personally reviewed and evaluated these rad and lab results as part of my medical decision-making.   EKG Interpretation None      MDM   Final diagnoses:  RLQ abdominal pain   Patient presents for right lower quadrant abdominal pain that began last night. Catherine [name] also reports diarrhea and nausea. Catherine [name] denied any vaginal bleeding or discharge. Catherine [name] denies any dysuria or hematuria. Status post salpingectomy and hysterectomy. No history of bowel disease. No rectal bleeding. Catherine [name] has significant right lower quadrant abdominal tenderness on exam. Her vital signs are stable and Catherine [name] is well-appearing. Her labs are unremarkable. Urinalysis is negative for UTI. No leukocytosis. Due to her significant abdominal pain with no history of appendectomy, CT abdomen was ordered.  CT abdomen shows no mass lesion or lymphadenopathy. Her appendix is normal. Her duodenum and small bowel are unremarkable. No hiatal hernia. Both ovaries appear normal without torsion. I discussed findings with the patient. I explained that Catherine [name] could take Tylenol or Motrin for pain. I also discussed follow-up and patient agrees with plan. Medications  ondansetron (ZOFRAN) injection 4 mg (4 mg Intravenous Given 07/02/15 1904)  HYDROmorphone (DILAUDID) injection 1 mg (1 mg Intravenous Given 07/02/15 1942)  iohexol (OMNIPAQUE) 300 MG/ML solution 80 mL (100 mLs Intravenous Contrast Given 07/02/15 1924)   Filed Vitals:   07/02/15 1759 07/02/15 2000  BP: 132/95 116/77  Pulse: 74 71  Temp: 98 F (36.7 C)   Resp: [address],  PA-C 07/02/15 2003  Forde Dandy, MD 07/02/15 2236

## 2015-07-02 NOTE — ED Notes (Signed)
Pt left with all her belongings and ambulated out of the treatment area.  

## 2015-07-02 NOTE — ED Notes (Signed)
C/o R sided abd pain since last night with diarrhea since this morning.  Reports decreased appetite with mild nausea and frequent urination today.

## 2015-07-03 LAB — URINE CULTURE
Culture: 20000
SPECIAL REQUESTS: NORMAL

## 2015-07-04 ENCOUNTER — Telehealth (HOSPITAL_COMMUNITY): Payer: Self-pay

## 2015-07-04 NOTE — Telephone Encounter (Signed)
Post ED Visit - Positive Culture Follow-up  Culture report reviewed by antimicrobial stewardship pharmacist:  []  Elenor Quinones, Pharm.D. []  Heide Guile, Pharm.D., BCPS []  Parks Neptune, Pharm.D. []  Alycia Rossetti, Pharm.D., BCPS []  [place], Pharm.D., BCPS, AAHIVP []  Legrand Como, Pharm.D., BCPS, AAHIVP []  Milus Glazier, Pharm.D. [] Crist Fat, Pharm.D.  Positive urine culture Treated with none,  no further patient follow-up is required at this time.  Ileene Musa 07/04/2015, 1:52 PM

## 2015-12-20 ENCOUNTER — Other Ambulatory Visit (HOSPITAL_COMMUNITY): Payer: Self-pay | Admitting: Nurse Practitioner

## 2015-12-20 ENCOUNTER — Ambulatory Visit (HOSPITAL_COMMUNITY)
Admission: RE | Admit: 2015-12-20 | Discharge: 2015-12-20 | Disposition: A | Discharge status: Home / Self Care | Payer: Managed Care, Other (non HMO) | Source: Ambulatory Visit | Attending: Nurse Practitioner | Admitting: Nurse Practitioner

## 2015-12-20 DIAGNOSIS — R5383 Other fatigue: Secondary | ICD-10-CM

## 2015-12-26 ENCOUNTER — Ambulatory Visit (INDEPENDENT_AMBULATORY_CARE_PROVIDER_SITE_OTHER): Payer: Managed Care, Other (non HMO) | Admitting: Neurology

## 2015-12-26 ENCOUNTER — Encounter: Payer: Self-pay | Admitting: Neurology

## 2015-12-26 VITALS — BP 160/98 | HR 80 | Resp 16 | Ht 69.0 in | Wt 262.0 lb

## 2015-12-26 DIAGNOSIS — R519 Headache, unspecified: Secondary | ICD-10-CM

## 2015-12-26 DIAGNOSIS — R51 Headache: Secondary | ICD-10-CM

## 2015-12-26 DIAGNOSIS — Z9189 Other specified personal risk factors, not elsewhere classified: Secondary | ICD-10-CM | POA: Diagnosis not present

## 2015-12-26 DIAGNOSIS — G4710 Hypersomnia, unspecified: Secondary | ICD-10-CM | POA: Diagnosis not present

## 2015-12-26 DIAGNOSIS — R0683 Snoring: Secondary | ICD-10-CM

## 2015-12-26 DIAGNOSIS — G4761 Periodic limb movement disorder: Secondary | ICD-10-CM | POA: Diagnosis not present

## 2015-12-26 DIAGNOSIS — R0681 Apnea, not elsewhere classified: Secondary | ICD-10-CM | POA: Diagnosis not present

## 2015-12-26 DIAGNOSIS — Z789 Other specified health status: Secondary | ICD-10-CM

## 2015-12-26 NOTE — Progress Notes (Signed)
Subjective:    Patient ID: Catherine [name] is a 40 y.o. female.  HPI     Star Age, MD, PhD [facility] [address], [address].O. [place], [place] [address]  Dear Burr Medico,   I saw your patient, Catherine [name], upon your kind request in my neurologic clinic today for initial consultation of her sleep disturbance, in particular, concern for underlying obstructive sleep apnea. The patient is unaccompanied today. As you know, Mr. Depena is a 40 year old right-handed woman with an underlying medical history of hypothyroidism, reflux disease, allergies, kidney stones, allergic rhinitis, chronic low back pain (on Tramadol), migraine headaches, for which she has previously seen my colleague Dr. Jaynee Eagles, and obesity, who reports snoring and excessive daytime somnolence. Her Epworth sleepiness score is 17 out of 24 today, her fatigue score is 47 out of 63. She has dozed off at the wheel. She reports witnessed apneas as well. She lives at home with her husband. She has 1 child. She quit smoking in April 2013. She drinks alcohol very infrequently, maybe 2-3 times a year. She drinks quite a bit of caffeine in the form of soda, 60 ounces daily, as late as 7 PM. She drinks 32 ounces of Mountain Dew and sweet tea with dinner. She drinks coffee in the morning. Her migraines generally speaking are improved. She noted aspertame as a trigger. She does have frequent morning headaches. Bedtime is between 9 and 10 PM, and she falls asleep with some delay. She has recently been started on modafinil 200 mg strength which helps her stay awake during the day. 200 mg once daily was keeping her up at night, she currently takes 100 mg in the mornings. 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 Her husband has witnessed apneic pauses in her sleep as well as gasping sounds and twitching of her limbs. She denies frank restless leg symptoms however. She has a family history of OSA in a  maternal aunt who uses a CPAP machine. She lives at home with her husband an 20-month-old adopted son.  I reviewed your office note from 12/20/2015, which you kindly included. She recently had blood work in your office on 12/20/2015 including TSH, free T4. You also ordered a repeat brain MRI. She is no longer on Depakote, or amitriptyline. Of note, she had a brain MRI without contrast which was ordered by Dr. Jaynee Eagles on 03/03/2014 and I reviewed the results:  IMPRESSION:  Unremarkable MRI scan of the brain without contrast. Incidental small mucous retention cyst/polyp is noted in the left maxillary sinus.  She had MRV head with and without contrast on 03/10/2014 which I reviewed: IMPRESSION: Unremarkable MRV of the brain showing patent superficial and deep cerebral venous system.  She had an MRA head without contrast on 03/10/2014, which I reviewed:  IMPRESSION: Normal MRA of the brain showing no significant stenosis of the large or medium-sized intracranial vessels.  She reports significant weight gain since her hysterectomy in April 2015. She weighed 229 at the time, current weight at 262.  Her Past Medical History Is Significant For: Past Medical History  Diagnosis Date  . Hypothyroidism   . GERD (gastroesophageal reflux disease)   . FHx: allergies   . Kidney stones   . Allergic rhinitis   . H/O scarlet fever   . Anxiety   . Deviated septum   . History of kidney stones   . Hiatal hernia   . Hypertension  Her Past Surgical History Is Significant For: Past Surgical History  Procedure Laterality Date  . Bladder surgery      reports had bladder stretched as child  . Wisdom tooth extraction    . Abdominal hysterectomy N/A 10/06/2013    Procedure: HYSTERECTOMY ABDOMINAL;  Surgeon: Florian Buff, MD;  Location: [facility];  Service: Gynecology;  Laterality: N/A;  . Bilateral salpingectomy Bilateral 10/06/2013    Procedure: BILATERAL SALPINGECTOMY;  Surgeon: Florian Buff, MD;  Location: [facility];  Service: Gynecology;  Laterality: Bilateral;  . Cholecystectomy N/A 11/26/2013    Procedure: LAPAROSCOPIC CHOLECYSTECTOMY;  Surgeon: Scherry Ran, MD;  Location: [facility];  Service: General;  Laterality: N/A;    Her Family History Is Significant For: Family History  Problem Relation Age of Onset  . Heart disease Father   . Hypertension Father   . Cancer Father   . Cirrhosis Father   . Cancer Maternal Aunt   . Thyroid disease Maternal Aunt   . Cancer Paternal Aunt   . Thyroid disease Maternal Grandmother   . Cancer Maternal Grandmother   . Diabetes Maternal Grandfather   . Hypertension Maternal Grandfather   . Migraines Neg Hx     Her Social History Is Significant For: Social History   Social History  . Marital Status: Married    Spouse Name: [name]   . Number of Children: 1  . Years of Education: 12   Occupational History  . [place] History Main Topics  . Smoking status: Former Smoker -- 0.50 packs/day for 19 years    Types: Cigarettes    Quit date: 10/08/2011  . Smokeless tobacco: Never Used  . Alcohol Use: 0.0 oz/week    0 Standard drinks or equivalent per week     Comment: very rarley  . Drug Use: No  . Sexual Activity: Yes   Other Topics Concern  . None   Social History Narrative   Patient is a non smoker.   Patient works full-time.   Patient is married.    Patient has a high school education.    Patient works at [employer]    Caffeine: 60+ oz a day     Her Allergies Are:  Allergies  Allergen Reactions  . Morphine And Related Other (See Comments)    headache  . Sulfa Antibiotics Other (See Comments)    Gives pt Yeast infection  :   Her Current Medications Are:  Outpatient Encounter Prescriptions as of 12/26/2015  Medication Sig  . citalopram (CELEXA) 20 MG tablet Take 20 mg by mouth daily.  [name] levothyroxine (SYNTHROID, LEVOTHROID) 150 MCG tablet   . meloxicam (MOBIC) 7.5 MG tablet   . modafinil (PROVIGIL) 200 MG tablet    . traMADol (ULTRAM) 50 MG tablet Take by mouth every 6 (six) hours as needed.  . [DISCONTINUED] amitriptyline (ELAVIL) 25 MG tablet Take 4 tablets (100 mg total) by mouth at bedtime. Start with one tab at bedtime. Every 3-4 days can add a pill at night until taking 4 pills before bed as tolerated and needed.  . [DISCONTINUED] celecoxib (CELEBREX) 200 MG capsule Take 1 capsule (200 mg total) by mouth 2 (two) times daily.  . [DISCONTINUED] dexamethasone (DECADRON) 4 MG tablet Take 3 tablets on day one. Take 2 tablets on day 2. Take 1 tablet on day 3.  . [DISCONTINUED] diazepam (VALIUM) 10 MG tablet Take 1 tablet (10 mg total) by mouth every 6 (six) hours as  needed for anxiety.  . [DISCONTINUED] divalproex (DEPAKOTE) 500 MG DR tablet Take 1 tablet (500 mg total) by mouth daily.  . [DISCONTINUED] levothyroxine (SYNTHROID, LEVOTHROID) 112 MCG tablet Take 112 mcg by mouth daily before breakfast.  . [DISCONTINUED] levothyroxine (SYNTHROID, LEVOTHROID) 112 MCG tablet Take by mouth.  . [DISCONTINUED] methylPREDNISolone (MEDROL DOSEPAK) 4 MG tablet follow package directions  . [DISCONTINUED] rizatriptan (MAXALT-MLT) 10 MG disintegrating tablet   . [DISCONTINUED] SUMAtriptan (IMITREX) 100 MG tablet   . [DISCONTINUED] topiramate (TOPAMAX) 50 MG tablet Take 1 tablet (50 mg total) by mouth 2 (two) times daily.  . [DISCONTINUED] venlafaxine XR (EFFEXOR XR) 75 MG 24 hr capsule Take 1 capsule (75 mg total) by mouth daily with breakfast.   Facility-Administered Encounter Medications as of 12/26/2015  Medication  . methylPREDNISolone sodium succinate (SOLU-MEDROL) 250 mg in sodium chloride 0.9 % 100 mL IVPB  . valproate (DEPACON) 1,000 mg in sodium chloride 0.9 % 100 mL IVPB  . valproate (DEPACON) 500 mg in sodium chloride 0.9 % 100 mL IVPB  :  Review of Systems:  Out of a complete 14 point review of systems, all are reviewed and negative with the exception of these symptoms as listed below:    Review of  Systems  Neurological:       Patient has trouble falling asleep, witnessed apnea, snoring, wakes up feeling tired, headaches, daytime tiredness, falls asleep when sitting Patient is currently taking modafinil, drinks coffee and 5-hour energy drinks but still falls asleep.    Epworth Sleepiness Scale 0= would never doze 1= slight chance of dozing 2= moderate chance of dozing 3= high chance of dozing  Sitting and reading:2 Watching TV:3 Sitting inactive in a public place (ex. Theater or meeting):2 As a passenger in a car for an hour without a break:3 Lying down to rest in the afternoon:3 Sitting and talking to someone:0 Sitting quietly after lunch (no alcohol):2 In a car, while stopped in traffic:2 Total:17  Objective:  Neurologic Exam  Physical Exam Physical Examination:   Filed Vitals:   12/26/15 1414  BP: 160/98  Pulse: 80  Resp: 16    General Examination: The patient is a very pleasant 40 y.o. female in no acute distress. She appears well-developed and well-nourished and well groomed.   HEENT: Normocephalic, atraumatic, pupils are equal, round and reactive to light and accommodation. Funduscopic exam is normal with sharp disc margins noted. Extraocular tracking is good without limitation to gaze excursion or nystagmus noted. Normal smooth pursuit is noted. Hearing is grossly intact. Tympanic membranes are clear bilaterally. Face is symmetric with normal facial animation and normal facial sensation. Speech is clear with no dysarthria noted. There is no hypophonia. There is no lip, neck/head, jaw or voice tremor. Neck is supple with full range of passive and active motion. There are no carotid bruits on auscultation. Oropharynx exam reveals: mild mouth dryness, good dental hygiene and moderate airway crowding, due to smaller airway entry, larger uvula, larger tonsils 2-3+ bilaterally. Mallampati is class II. Tongue protrudes centrally and palate elevates symmetrically. Neck size  is 16 3/8 inches. She has a Mild overbite. Nasal inspection reveals no significant nasal mucosal bogginess or redness and mild septal deviation to the L.   Chest: Clear to auscultation without wheezing, rhonchi or crackles noted.  Heart: S1+S2+0, regular and normal without murmurs, rubs or gallops noted.   Abdomen: Soft, non-tender and non-distended with normal bowel sounds appreciated on auscultation.  Extremities: There is trace pitting edema in the distal  lower extremities bilaterally. Pedal pulses are intact.  Skin: Warm and dry without trophic changes noted. There are no varicose veins.  Musculoskeletal: exam reveals no obvious joint deformities, tenderness or joint swelling or erythema.   Neurologically:  Mental status: The patient is awake, alert and oriented in all 4 spheres. Her immediate and remote memory, attention, language skills and fund of knowledge are appropriate. There is no evidence of aphasia, agnosia, apraxia or anomia. Speech is clear with normal prosody and enunciation. Thought process is linear. Mood is normal and affect is normal.  Cranial nerves II - XII are as described above under HEENT exam. In addition: shoulder shrug is normal with equal shoulder height noted. Motor exam: Normal bulk, strength and tone is noted. There is no drift, tremor or rebound. Romberg is negative. Reflexes are 1-2+ throughout. Fine motor skills and coordination: intact with normal finger taps, normal hand movements, normal rapid alternating patting, normal foot taps and normal foot agility.  Cerebellar testing: No dysmetria or intention tremor on finger to nose testing. Heel to shin is unremarkable bilaterally. There is no truncal or gait ataxia.  Sensory exam: intact to light touch, pinprick, vibration, temperature sense in the upper and lower extremities, with the exception of decreased vibration sense in the distal lower extremities bilaterally.  Gait, station and balance: She stands  easily. No veering to one side is noted. No leaning to one side is noted. Posture is age-appropriate and stance is narrow based. Gait shows normal stride length and normal pace. No problems turning are noted. Tandem walk is unremarkable.   Assessment and Plan:  In summary, Catherine [name] is a very pleasant 40 y.o.-year old female with an underlying medical history of hypothyroidism, reflux disease, allergies, kidney stones, allergic rhinitis, chronic low back pain (on Tramadol), migraine headaches, for which she has previously seen my colleague Dr. Jaynee Eagles, and obesity, whose history and physical exam are indeed concerning for obstructive sleep apnea (OSA). I had a long chat with the patient about my findings and the diagnosis of OSA, its prognosis and treatment options. We talked about medical treatments, surgical interventions and non-pharmacological approaches. I explained in particular the risks and ramifications of untreated moderate to severe OSA, especially with respect to developing cardiovascular disease down the Road, including congestive heart failure, difficult to treat hypertension, cardiac arrhythmias, or stroke. Even type 2 diabetes has, in part, been linked to untreated OSA. Symptoms of untreated OSA include daytime sleepiness, memory problems, mood irritability and mood disorder such as depression and anxiety, lack of energy, as well as recurrent headaches, especially morning headaches. We talked about trying to maintain a healthy lifestyle in general, as well as the importance of weight control. I encouraged the patient to eat healthy, exercise daily and keep well hydrated, to keep a scheduled bedtime and wake time routine, to not skip any meals and eat healthy snacks in between meals. I advised the patient not to drive when feeling sleepy. She is advised to reduce her soda intake and reduce her caffeine intake, avoid caffeine after 5 PM. Furthermore, she is encouraged to work on weight loss. I  recommended the following at this time: sleep study with potential positive airway pressure titration. (We will score hypopneas at 4% and split the sleep study into diagnostic and treatment portion, if the estimated. 2 hour AHI is >15/h).   I explained the sleep test procedure to the patient and also outlined possible surgical and non-surgical treatment options of OSA, including the use  of a custom-made dental device (which would require a referral to a specialist dentist or oral surgeon), upper airway surgical options, such as pillar implants, radiofrequency surgery, tongue base surgery, and UPPP (which would involve a referral to an ENT surgeon). Rarely, jaw surgery such as mandibular advancement may be considered.  I also explained the CPAP treatment option to the patient, who indicated that she would be willing to try CPAP if the need arises. I explained the importance of being compliant with PAP treatment, not only for insurance purposes but primarily to improve Her symptoms, and for the patient's long term health benefit, including to reduce Her cardiovascular risks. I answered all her questions today and the patient was in agreement. I would like to see her back after the sleep study is completed and encouraged her to call with any interim questions, concerns, problems or updates.   Thank you very much for allowing me to participate in the care of this nice patient. If I can be of any further assistance to you please do not hesitate to call me at [phone].  Sincerely,   Star Age, MD, PhD

## 2015-12-26 NOTE — Patient Instructions (Addendum)
Based on your symptoms and your exam I believe you are at risk for obstructive sleep apnea or OSA, and I think we should proceed with a sleep study to determine whether you do or do not have OSA and how severe it is. If you have more than mild OSA, I want you to consider treatment with CPAP. Please remember, the risks and ramifications of moderate to severe obstructive sleep apnea or OSA are: Cardiovascular disease, including congestive heart failure, stroke, difficult to control hypertension, arrhythmias, and even type 2 diabetes has been linked to untreated OSA. Sleep apnea causes disruption of sleep and sleep deprivation in most cases, which, in turn, can cause recurrent headaches, problems with memory, mood, concentration, focus, and vigilance. Most people with untreated sleep apnea report excessive daytime sleepiness, which can affect their ability to drive. Please do not drive if you feel sleepy.   I will likely see you back after your sleep study to go over the test results and where to go from there. We will call you after your sleep study to advise about the results (most likely, you will hear from Beverlee Nims, my nurse) and to set up an appointment at the time, as necessary.    Our sleep lab administrative assistant, Arrie Aran will meet with you or call you to schedule your sleep study. If you don't hear back from her by next week please feel free to call her at [phone]. This is her direct line and please leave a message with your phone number to call back if you get the voicemail box. She will call back as soon as possible.   Avoid caffeine after 5 PM. Reduce your soda and sweet tea intake.

## 2016-01-18 ENCOUNTER — Ambulatory Visit (INDEPENDENT_AMBULATORY_CARE_PROVIDER_SITE_OTHER): Payer: Managed Care, Other (non HMO) | Admitting: Neurology

## 2016-01-18 DIAGNOSIS — G4733 Obstructive sleep apnea (adult) (pediatric): Secondary | ICD-10-CM | POA: Diagnosis not present

## 2016-01-18 DIAGNOSIS — G479 Sleep disorder, unspecified: Secondary | ICD-10-CM

## 2016-01-18 DIAGNOSIS — G4720 Circadian rhythm sleep disorder, unspecified type: Secondary | ICD-10-CM

## 2016-01-24 ENCOUNTER — Telehealth: Payer: Self-pay | Admitting: Neurology

## 2016-01-24 ENCOUNTER — Encounter: Payer: Self-pay | Admitting: Neurology

## 2016-01-24 DIAGNOSIS — G4733 Obstructive sleep apnea (adult) (pediatric): Secondary | ICD-10-CM

## 2016-01-24 NOTE — Telephone Encounter (Signed)
I spoke to patient and she is aware of results and recommendations. She is willing to proceed with treatment. I will send orders to [facility]. I will send report to PCP. I will also send the patient a letter to remind her to make f/u appt and stress the importance of compliance.

## 2016-01-24 NOTE — Telephone Encounter (Signed)
Catherine [name]:  Patient referred by Catherine [name], seen by me on 12/26/15, split study on 01/18/16, Ins: [employer]. Please call and notify patient that the recent sleep study confirmed the diagnosis of severe OSA. She did well with CPAP during the study with significant improvement of the respiratory events. Therefore, I would like start the patient on CPAP therapy at home by prescribing a machine for home use. I placed the order in the chart. The patient will need a follow up appointment with me in 8 to 10 weeks post set up that has to be scheduled; please go ahead and schedule while you have the patient on the phone and make sure patient understands the importance of keeping this window for the FU appointment, as it is often an insurance requirement and failing to adhere to this may result in losing coverage for sleep apnea treatment.  Please re-enforce the importance of compliance with treatment and the need for [place] to monitor compliance data - again an insurance requirement and good feedback for the patient as far as how they are doing.  Also remind patient, that any upcoming CPAP machine or mask issues, should be first addressed with the DME company. Please ask if patient has a preference regarding DME company.  Please arrange for CPAP set up at home through a DME company of patient's choice - once you have spoken to the patient - and faxed/routed report to PCP and referring MD (if other than PCP), you can close this encounter, thanks,   Star Age, MD, PhD [facility] ([place])

## 2016-03-20 ENCOUNTER — Telehealth: Payer: Self-pay | Admitting: Neurology

## 2016-03-20 NOTE — Telephone Encounter (Signed)
I have patient's name down and will call her when we have some openings.

## 2016-03-20 NOTE — Telephone Encounter (Signed)
Appointment Request From: Catherine [name] Post    With Provider: Star Age, MD [[facility]]    Preferred Date Range: From 03/28/2016 To 05/02/2016    Preferred Times: Thursday Afternoon    Reason for visit: Office Visit    Comments:  I need to schedule my CPAP follow up so my insurance wont take it away.

## 2016-04-01 ENCOUNTER — Encounter: Payer: Self-pay | Admitting: Neurology

## 2016-04-02 ENCOUNTER — Encounter: Payer: Self-pay | Admitting: Neurology

## 2016-04-08 ENCOUNTER — Encounter: Payer: Self-pay | Admitting: Neurology

## 2016-04-08 ENCOUNTER — Ambulatory Visit (INDEPENDENT_AMBULATORY_CARE_PROVIDER_SITE_OTHER): Payer: Managed Care, Other (non HMO) | Admitting: Neurology

## 2016-04-08 VITALS — BP 142/70 | HR 96 | Resp 16 | Ht 69.0 in | Wt 260.0 lb

## 2016-04-08 DIAGNOSIS — G4733 Obstructive sleep apnea (adult) (pediatric): Secondary | ICD-10-CM

## 2016-04-08 DIAGNOSIS — Z9989 Dependence on other enabling machines and devices: Secondary | ICD-10-CM

## 2016-04-08 NOTE — Patient Instructions (Addendum)
Please continue using your CPAP regularly. While your insurance requires that you use CPAP at least 4 hours each night on 70% of the nights, I recommend, that you not skip any nights and use it throughout the night if you can. Getting used to CPAP and staying with the treatment long term does take time and patience and discipline. Untreated obstructive sleep apnea when it is moderate to severe can have an adverse impact on cardiovascular health and raise her risk for heart disease, arrhythmias, hypertension, congestive heart failure, stroke and diabetes. Untreated obstructive sleep apnea causes sleep disruption, nonrestorative sleep, and sleep deprivation. This can have an impact on your day to day functioning and cause daytime sleepiness and impairment of cognitive function, memory loss, mood disturbance, and problems focussing. Using CPAP regularly can improve these symptoms.  We will try a full face mask for your mouth breathing.   Try to work on weight loss! Reduced soda and sweet tea intake and increase water intake, about 8 glasses (8 oz each) per day.   Keep up the good work! I will see you back in 6 months for sleep apnea check up, and if you continue to do well on CPAP I will see you once a year thereafter.

## 2016-04-08 NOTE — Progress Notes (Signed)
Subjective:    Patient ID: Catherine [name] is a 40 y.o. female.  HPI     Interim history:   Catherine [name] is a 40 year old right-handed woman with an underlying medical history of hypothyroidism, reflux disease, allergies, kidney stones, allergic rhinitis, chronic low back pain (on Tramadol), migraine headaches, for which she has previously seen my colleague Dr. Jaynee Eagles, and obesity, who presents for follow-up consultation of her sleep apnea, after her recent split-night sleep study. The patient is unaccompanied today. I first met her on 12/26/2015 at the request of her primary care provider, at which time the patient reported snoring and excessive daytime somnolence. I invited her for sleep study. She had a split-night sleep study on 01/18/2016. I went over her test results with her in detail today. Baseline sleep efficiency was 89.1% with a latency to sleep of 18 minutes and wake after sleep onset of 8 minutes. She had an increased arousal index. She had increased stage II sleep, absence of slow-wave sleep and REM sleep was 3.1% with a prolonged REM latency. She had no significant PLMS before or after CPAP therapy. She had moderate snoring. Total AHI was 32.1 per hour, average oxygen saturation 92%, nadir was 82%. She was therefore titrated on CPAP during the second part of the study, starting at 1:07 AM. Sleep efficiency was 89%. Sleep latency was 9 minutes and wake after sleep onset was 21 minutes. REM sleep was increased at 38.8%. She had absence of slow-wave sleep. Average oxygen saturation was improved at 94%, nadir was 88%. CPAP was titrated from 5 cm to 8 cm. AHI was 0 per hour on the final pressure with supine REM sleep achieved. Based on her test results are prescribed CPAP therapy for home use.  Today, 04/08/2016: I reviewed her CPAP compliance data from 03/04/2016 through 04/02/2016 which is a total of 30 days, during which time she used her CPAP 28 days with percent used days greater than 4 hours  at 70%, indicating adequate compliance with an average usage of 4 hours and 29 minutes, residual AHI 2.8 per hour, leak acceptable with the 95th percentile at 15.7 L/m on a pressure of 8 cm with EPR of 3.  Today, 04/08/2016: She reports doing okay, CPAP has helped with AM headaches, nocturia about the same. Better rested in AMs, less tired during the day. Has not reduced her soda and tea intake, not trying very hard to lose weight. Would like to try a different mask due to mouth breathing. She has tried a chinstrap which has not helped fully.   Previously:   12/26/2015: She reports snoring and excessive daytime somnolence. Her Epworth sleepiness score is 17 out of 24 today, her fatigue score is 47 out of 63. She has dozed off at the wheel. She reports witnessed apneas as well. She lives at home with her husband. She has 1 child. She quit smoking in April 2013. She drinks alcohol very infrequently, maybe 2-3 times a year. She drinks quite a bit of caffeine in the form of soda, 60 ounces daily, as late as 7 PM. She drinks 32 ounces of Mountain Dew and sweet tea with dinner. She drinks coffee in the morning. Her migraines generally speaking are improved. She noted aspertame as a trigger. She does have frequent morning headaches. Bedtime is between 9 and 10 PM, and she falls asleep with some delay. She has recently been started on modafinil 200 mg strength which helps her stay awake during the day. 200 mg  once daily was keeping her up at night, she currently takes 100 mg in the mornings. 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 Her husband has witnessed apneic pauses in her sleep as well as gasping sounds and twitching of her limbs. She denies frank restless leg symptoms however. She has a family history of OSA in a maternal aunt who uses a CPAP machine. She lives at home with her husband an 32-monthold adopted son.  I reviewed your office note from 12/20/2015, which you kindly included.  She recently had blood work in your office on 12/20/2015 including TSH, free T4. You also ordered a repeat brain MRI. She is no longer on Depakote, or amitriptyline. Of note, she had a brain MRI without contrast which was ordered by Dr. AJaynee Eagleson 03/03/2014 and I reviewed the results:  IMPRESSION:  Unremarkable MRI scan of the brain without contrast. Incidental small mucous retention cyst/polyp is noted in the left maxillary sinus.  She had MRV head with and without contrast on 03/10/2014 which I reviewed: IMPRESSION: Unremarkable MRV of the brain showing patent superficial and deep cerebral venous system.  She had an MRA head without contrast on 03/10/2014, which I reviewed:  IMPRESSION: Normal MRA of the brain showing no significant stenosis of the large or medium-sized intracranial vessels.   She reports significant weight gain since her hysterectomy in April 2015. She weighed 229 at the time, current weight at 262.  Her Past Medical History Is Significant For: Past Medical History:  Diagnosis Date  . Allergic rhinitis   . Anxiety   . Deviated septum   . FHx: allergies   . GERD (gastroesophageal reflux disease)   . H/O scarlet fever   . Hiatal hernia   . History of kidney stones   . Hypertension   . Hypothyroidism   . Kidney stones     Her Past Surgical History Is Significant For: Past Surgical History:  Procedure Laterality Date  . ABDOMINAL HYSTERECTOMY N/A 10/06/2013   Procedure: HYSTERECTOMY ABDOMINAL;  Surgeon: LFlorian Buff MD;  Location: [facility];  Service: Gynecology;  Laterality: N/A;  . BILATERAL SALPINGECTOMY Bilateral 10/06/2013   Procedure: BILATERAL SALPINGECTOMY;  Surgeon: LFlorian Buff MD;  Location: [facility];  Service: Gynecology;  Laterality: Bilateral;  . BLADDER SURGERY     reports had bladder stretched as child  . CHOLECYSTECTOMY N/A 11/26/2013   Procedure: LAPAROSCOPIC CHOLECYSTECTOMY;  Surgeon: WScherry Ran MD;  Location: [facility];  Service: General;   Laterality: N/A;  . WISDOM TOOTH EXTRACTION      Her Family History Is Significant For: Family History  Problem Relation Age of Onset  . Heart disease Father   . Hypertension Father   . Cancer Father   . Cirrhosis Father   . Cancer Maternal Aunt   . Thyroid disease Maternal Aunt   . Cancer Paternal Aunt   . Thyroid disease Maternal Grandmother   . Cancer Maternal Grandmother   . Diabetes Maternal Grandfather   . Hypertension Maternal Grandfather   . Migraines Neg Hx     Her Social History Is Significant For: Social History   Social History  . Marital status: Married    Spouse name: [name]   . Number of children: 1  . Years of education: 138  Occupational History  . [employer]  . Smoking status: Former Smoker    Packs/day: 0.50    Years: 19.00  Types: Cigarettes    Quit date: 10/08/2011  . Smokeless tobacco: Never Used  . Alcohol use 0.0 oz/week     Comment: very rarley  . Drug use: No  . Sexual activity: Yes   Other Topics Concern  . None   Social History Narrative   Patient is a non smoker.   Patient works full-time.   Patient is married.    Patient has a high school education.    Patient works at [employer]    Caffeine: 60+ oz a day     Her Allergies Are:  Allergies  Allergen Reactions  . Morphine And Related Other (See Comments)    headache  . Sulfa Antibiotics Other (See Comments)    Gives pt Yeast infection  :   Her Current Medications Are:  Outpatient Encounter Prescriptions as of 04/08/2016  Medication Sig  . escitalopram (LEXAPRO) 20 MG tablet   . levothyroxine (SYNTHROID, LEVOTHROID) 150 MCG tablet   . meloxicam (MOBIC) 7.5 MG tablet   . modafinil (PROVIGIL) 200 MG tablet   . traMADol (ULTRAM) 50 MG tablet Take by mouth every 6 (six) hours as needed.  . [DISCONTINUED] citalopram (CELEXA) 20 MG tablet Take 20 mg by mouth daily.   Facility-Administered Encounter Medications as of 04/08/2016   Medication  . methylPREDNISolone sodium succinate (SOLU-MEDROL) 250 mg in sodium chloride 0.9 % 100 mL IVPB  . valproate (DEPACON) 1,000 mg in sodium chloride 0.9 % 100 mL IVPB  . valproate (DEPACON) 500 mg in sodium chloride 0.9 % 100 mL IVPB  :  Review of Systems:  Out of a complete 14 point review of systems, all are reviewed and negative with the exception of these symptoms as listed below:  Review of Systems  Neurological:       Patient states that she is doing well with CPAP. She would like to try a different mask due to breathing out of her mouth at night.     Objective:  Neurologic Exam  Physical Exam Physical Examination:   Vitals:   04/08/16 1043  BP: (!) 142/70  Pulse: 96  Resp: 16    General Examination: The patient is a very pleasant 40 y.o. female in no acute distress. She appears well-developed and well-nourished and well groomed. Good spirits today.  HEENT: Normocephalic, atraumatic, pupils are equal, round and reactive to light and accommodation. Extraocular tracking is good without limitation to gaze excursion or nystagmus noted. Normal smooth pursuit is noted. Hearing is grossly intact. Face is symmetric with normal facial animation and normal facial sensation. Speech is clear with no dysarthria noted. There is no hypophonia. There is no lip, neck/head, jaw or voice tremor. Neck is supple with full range of passive and active motion. There are no carotid bruits on auscultation. Oropharynx exam reveals: mild mouth dryness, good dental hygiene and moderate airway crowding, due to smaller airway entry, larger uvula, larger tonsils 2-3+ bilaterally. Mallampati is class II. Tongue protrudes centrally and palate elevates symmetrically.    Chest: Clear to auscultation without wheezing, rhonchi or crackles noted.  Heart: S1+S2+0, regular and normal without murmurs, rubs or gallops noted.   Abdomen: Soft, non-tender and non-distended with normal bowel sounds appreciated  on auscultation.  Extremities: There is non pitting puffiness noted around the ankles.  Skin: Warm and dry without trophic changes noted. There are no varicose veins.  Musculoskeletal: exam reveals no obvious joint deformities, tenderness or joint swelling or erythema.   Neurologically:  Mental status: The patient is awake,  alert and oriented in all 4 spheres. Her immediate and remote memory, attention, language skills and fund of knowledge are appropriate. There is no evidence of aphasia, agnosia, apraxia or anomia. Speech is clear with normal prosody and enunciation. Thought process is linear. Mood is normal and affect is normal.  Cranial nerves II - XII are as described above under HEENT exam. In addition: shoulder shrug is normal with equal shoulder height noted. Motor exam: Normal bulk, strength and tone is noted. There is no drift, tremor or rebound. Romberg is negative. Reflexes are 1-2+ throughout. Fine motor skills and coordination: intact with normal finger taps, normal hand movements, normal rapid alternating patting, normal foot taps and normal foot agility.  Cerebellar testing: No dysmetria or intention tremor on finger to nose testing. Heel to shin is unremarkable bilaterally. There is no truncal or gait ataxia.  Sensory exam: intact to light touch in the upper and lower extremities.  Gait, station and balance: She stands easily. No veering to one side is noted. No leaning to one side is noted. Posture is age-appropriate and stance is narrow based. Gait shows normal stride length and normal pace. No problems turning are noted. Tandem walk is unremarkable.   Assessment and Plan:  In summary, ONETTA SPAINHOWER is a very pleasant 40 year old female with an underlying medical history of hypothyroidism, reflux disease, allergies, kidney stones, allergic rhinitis, chronic low back pain (on Tramadol), migraine headaches (saw Dr. Jaynee Eagles for this), and obesity, who Presents for follow-up  consultation of her sleep disorder, after her recent sleep study, which confirmed severe obstructive sleep apnea. She had a split-night sleep study in August 2017 and we discussed the results in detail today. She has been on CPAP therapy with adequate compliance and residual issues with mask tolerance. She is commended for her CPAP adherence and is encouraged to continue to work on using CPAP all night every night. She is currently using nasal pillows and I prescribed a full facemask. She reports improvement in her daytime tiredness, energy level, sleep consolidation and morning headaches. Physical exam and neurological exam are stable. I again explained the risks and ramifications of untreated moderate to severe OSA, especially with respect to developing cardiovascular disease down the Road, including congestive heart failure, difficult to treat hypertension, cardiac arrhythmias, or stroke. Even type 2 diabetes has, in part, been linked to untreated OSA. Symptoms of untreated OSA include daytime sleepiness, memory problems, mood irritability and mood disorder such as depression and anxiety, lack of energy, as well as recurrent headaches, especially morning headaches. We talked about trying to maintain a healthy lifestyle in general, as well as the importance of weight control. She is again advised to reduce her soda intake and her sweet tea intake, avoid caffeine after 5 PM and increase her water intake. Furthermore, she is encouraged to work on weight loss. She is advised to follow-up in 6 months, sooner as needed. I answered all her questions today and she was in agreement. I spent 25 minutes in total face-to-face time with the patient, more than 50% of which was spent in counseling and coordination of care, reviewing test results, reviewing medication and discussing or reviewing the diagnosis of OSA, its prognosis and treatment options.

## 2016-04-28 ENCOUNTER — Encounter: Payer: Self-pay | Admitting: Neurology

## 2016-04-29 NOTE — Telephone Encounter (Signed)
Heather with [facility] advised me that patient has an appointment next Tuesday for mask refit.

## 2016-06-30 ENCOUNTER — Ambulatory Visit (HOSPITAL_COMMUNITY)
Admission: RE | Admit: 2016-06-30 | Discharge: 2016-06-30 | Disposition: A | Discharge status: Home / Self Care | Payer: Commercial Managed Care - PPO | Attending: Psychiatry | Admitting: Psychiatry

## 2016-06-30 ENCOUNTER — Encounter (HOSPITAL_COMMUNITY): Payer: Self-pay | Admitting: Behavioral Health
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
## 2016-06-30 NOTE — H&P (Signed)
Behavioral Health Medical Screening Exam  Catherine [name] is an 41 y.o. female.  Total Time spent with patient: 20 minutes  Psychiatric Specialty Exam: Physical Exam  Constitutional: She is oriented to person, place, and time. She appears well-developed and well-nourished.  HENT:  Head: Normocephalic.  Neck: Normal range of motion.  Cardiovascular: Normal rate and normal heart sounds.   Respiratory: Effort normal and breath sounds normal.  GI: Soft. Bowel sounds are normal.  Musculoskeletal: Normal range of motion.  Neurological: She is alert and oriented to person, place, and time.  Skin: Skin is warm and dry.    Review of Systems  Psychiatric/Behavioral: Positive for depression. Negative for hallucinations, memory loss, substance abuse and suicidal ideas. The patient is nervous/anxious. The patient does not have insomnia.   All other systems reviewed and are negative.   Blood pressure (!) 152/89, pulse 89, temperature 98.1 F (36.7 C), temperature source Oral, resp. rate 18, last menstrual period 09/18/2013, SpO2 96 %.There is no height or weight on file to calculate BMI.  General Appearance: Casual  Eye Contact:  Good  Speech:  Clear and Coherent and Normal Rate  Volume:  Normal  Mood:  Anxious and Depressed  Affect:  Congruent and Depressed  Thought Process:  Coherent, Goal Directed and Linear  Orientation:  Full (Time, Place, and Person)  Thought Content:  Logical  Suicidal Thoughts:  No  Homicidal Thoughts:  No  Memory:  Immediate;   Good Recent;   Good Remote;   Fair  Judgement:  Good  Insight:  Good  Psychomotor Activity:  Normal  Concentration: Concentration: Good and Attention Span: Good  Recall:  Good  Fund of Knowledge:Good  Language: Good  Akathisia:  No  Handed:  Right  AIMS (if indicated):     Assets:  Communication Skills Desire for Improvement Financial Resources/Insurance Housing Leisure Time Physical Health Resilience Social  Support Transportation Vocational/Educational  Sleep:       Musculoskeletal: Strength & Muscle Tone: within normal limits Gait & Station: normal Patient leans: N/A  Blood pressure (!) 152/89, pulse 89, temperature 98.1 F (36.7 C), temperature source Oral, resp. rate 18, last menstrual period 09/18/2013, SpO2 96 %.  Recommendations:  Based on my evaluation the patient does not appear to have an emergency medical condition.  Pt does not meet criteria for inpatient psychiatric admission. Outpatient resources were provided to pt.    Ethelene Hal, NP 06/30/2016, 11:33 AM

## 2016-07-04 ENCOUNTER — Encounter (HOSPITAL_COMMUNITY): Payer: Self-pay

## 2016-07-09 ENCOUNTER — Encounter (HOSPITAL_COMMUNITY): Payer: Self-pay | Admitting: Clinical

## 2016-07-09 ENCOUNTER — Ambulatory Visit (INDEPENDENT_AMBULATORY_CARE_PROVIDER_SITE_OTHER): Payer: Commercial Managed Care - PPO | Admitting: Clinical

## 2016-07-09 DIAGNOSIS — F331 Major depressive disorder, recurrent, moderate: Secondary | ICD-10-CM
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
## 2016-07-18 ENCOUNTER — Ambulatory Visit (INDEPENDENT_AMBULATORY_CARE_PROVIDER_SITE_OTHER): Payer: Commercial Managed Care - PPO | Admitting: Clinical

## 2016-07-18 DIAGNOSIS — F331 Major depressive disorder, recurrent, moderate: Secondary | ICD-10-CM

## 2016-07-18 NOTE — Progress Notes (Signed)
   THERAPIST PROGRESS NOTE  Session Time: 7:05 -8:00  Participation Level: Active  Behavioral Response: CasualAlertDepressed  Type of Therapy: Individual Therapy  Treatment Goals addressed: improve psychiatric symptoms, elevate mood , improve unhelpful thought patterns,  emotional regulation (stress management),  healthy coping skills  Interventions: Motivational Interviewing, Cognitive Behavior Therapy, Psychoeducation, Grounding and Mindfulness Techniques  Summary: Catherine [name] is a 41 y.o. female who presents with major depressive disorder, recurrent, moderate   Suicidal/Homicidal: Nowithout intent/plan  Therapist Response: Catherine [name] met with clinician for an individual session. Catherine [name] discussed her psychiatric symptoms, her current life events and her goals for therapy. Catherine [name] shared that she has continued to be depressed except for one day this past week. Clinician asked open ended questions but [name] was unable to identify anything that made that day better than the others. Clinician introduced grounding and mindfulness techniques. Clinician explained the process, purpose and practice of the techniques. Client and clinician practiced some of the techniques together. Catherine [name] asked clarifying questions which clinician answered. Clinician introduced some basic cbt concepts. Client and clinician discussed the thought emotion connection. Clinician introduced a 7 panel thought record sheet. Clinician explained how it would be used in future sessions. Clinician gave her a homework packet. She agreed to complete the homework packet and to practice her grounding and mindfulness techniques daily until next session  Plan: Return again in 1- 2 weeks.  Diagnosis: Axis I: major depressive disorder, recurrent, moderate       Eda Magnussen A, LCSW 07/18/2016

## 2016-07-25 ENCOUNTER — Ambulatory Visit (HOSPITAL_COMMUNITY): Payer: Self-pay | Admitting: Clinical

## 2016-07-31 ENCOUNTER — Encounter (HOSPITAL_COMMUNITY): Payer: Self-pay | Admitting: Psychiatry

## 2016-08-01 ENCOUNTER — Ambulatory Visit (HOSPITAL_COMMUNITY): Payer: Self-pay | Admitting: Clinical

## 2016-08-01 ENCOUNTER — Telehealth (HOSPITAL_COMMUNITY): Payer: Self-pay | Admitting: *Deleted

## 2016-08-01 NOTE — Telephone Encounter (Signed)
Opened in Error.

## 2016-08-02 ENCOUNTER — Encounter (HOSPITAL_COMMUNITY): Payer: Self-pay | Admitting: Psychiatry

## 2016-08-02 ENCOUNTER — Ambulatory Visit (INDEPENDENT_AMBULATORY_CARE_PROVIDER_SITE_OTHER): Payer: Commercial Managed Care - PPO | Admitting: Psychiatry

## 2016-08-02 VITALS — BP 130/94 | HR 61 | Ht 69.0 in | Wt 261.0 lb

## 2016-08-02 DIAGNOSIS — Z833 Family history of diabetes mellitus: Secondary | ICD-10-CM

## 2016-08-02 DIAGNOSIS — Z9049 Acquired absence of other specified parts of digestive tract: Secondary | ICD-10-CM | POA: Diagnosis not present

## 2016-08-02 DIAGNOSIS — Z90710 Acquired absence of both cervix and uterus: Secondary | ICD-10-CM | POA: Diagnosis not present

## 2016-08-02 DIAGNOSIS — Z8349 Family history of other endocrine, nutritional and metabolic diseases: Secondary | ICD-10-CM | POA: Diagnosis not present

## 2016-08-02 DIAGNOSIS — Z8379 Family history of other diseases of the digestive system: Secondary | ICD-10-CM

## 2016-08-02 DIAGNOSIS — F17210 Nicotine dependence, cigarettes, uncomplicated: Secondary | ICD-10-CM | POA: Diagnosis not present

## 2016-08-02 DIAGNOSIS — Z9889 Other specified postprocedural states: Secondary | ICD-10-CM | POA: Diagnosis not present

## 2016-08-02 DIAGNOSIS — Z888 Allergy status to other drugs, medicaments and biological substances status: Secondary | ICD-10-CM | POA: Diagnosis not present

## 2016-08-02 DIAGNOSIS — Z809 Family history of malignant neoplasm, unspecified: Secondary | ICD-10-CM

## 2016-08-02 DIAGNOSIS — Z8249 Family history of ischemic heart disease and other diseases of the circulatory system: Secondary | ICD-10-CM

## 2016-08-02 DIAGNOSIS — Z882 Allergy status to sulfonamides status: Secondary | ICD-10-CM | POA: Diagnosis not present

## 2016-08-02 DIAGNOSIS — R45851 Suicidal ideations: Secondary | ICD-10-CM

## 2016-08-02 DIAGNOSIS — F331 Major depressive disorder, recurrent, moderate: Secondary | ICD-10-CM | POA: Diagnosis not present

## 2016-08-02 DIAGNOSIS — Z79899 Other long term (current) drug therapy: Secondary | ICD-10-CM

## 2016-08-02 MED ORDER — BUPROPION HCL ER (XL) 150 MG PO TB24: 150.0000 mg | ORAL_TABLET | Freq: Every day | ORAL | 1 refills | Status: DC

## 2016-08-02 MED ORDER — LORAZEPAM 0.5 MG PO TABS: 0.5000 mg | ORAL_TABLET | Freq: Every day | ORAL | 0 refills | Status: DC | PRN

## 2016-08-02 MED ORDER — DULOXETINE HCL 60 MG PO CPEP: 60.0000 mg | ORAL_CAPSULE | Freq: Every day | ORAL | 1 refills | Status: DC

## 2016-08-02 NOTE — Patient Instructions (Signed)
1. Continue duloxetine 60 mg daily 2. Start Wellbutrin 150 mg daily 3. Start lorazepam 0.5 mg daily as needed for anxiety 4. Return to clinic in one month
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
## 2016-08-10 ENCOUNTER — Inpatient Hospital Stay (HOSPITAL_COMMUNITY)
Admission: RE | Admit: 2016-08-10 | Discharge: 2016-08-15 | DRG: 885 | Disposition: A | Discharge status: Home / Self Care | Payer: Commercial Managed Care - PPO | Attending: Psychiatry | Admitting: Psychiatry

## 2016-08-10 ENCOUNTER — Encounter (HOSPITAL_COMMUNITY): Payer: Self-pay

## 2016-08-10 DIAGNOSIS — F17210 Nicotine dependence, cigarettes, uncomplicated: Secondary | ICD-10-CM | POA: Diagnosis present

## 2016-08-10 DIAGNOSIS — Z8379 Family history of other diseases of the digestive system: Secondary | ICD-10-CM

## 2016-08-10 DIAGNOSIS — I10 Essential (primary) hypertension: Secondary | ICD-10-CM | POA: Diagnosis present

## 2016-08-10 DIAGNOSIS — F4310 Post-traumatic stress disorder, unspecified: Secondary | ICD-10-CM | POA: Diagnosis present

## 2016-08-10 DIAGNOSIS — J342 Deviated nasal septum: Secondary | ICD-10-CM | POA: Diagnosis present

## 2016-08-10 DIAGNOSIS — F419 Anxiety disorder, unspecified: Secondary | ICD-10-CM | POA: Diagnosis present

## 2016-08-10 DIAGNOSIS — R45851 Suicidal ideations: Secondary | ICD-10-CM | POA: Diagnosis present

## 2016-08-10 DIAGNOSIS — Z8249 Family history of ischemic heart disease and other diseases of the circulatory system: Secondary | ICD-10-CM | POA: Diagnosis not present

## 2016-08-10 DIAGNOSIS — Z833 Family history of diabetes mellitus: Secondary | ICD-10-CM | POA: Diagnosis not present

## 2016-08-10 DIAGNOSIS — K219 Gastro-esophageal reflux disease without esophagitis: Secondary | ICD-10-CM | POA: Diagnosis present

## 2016-08-10 DIAGNOSIS — Z23 Encounter for immunization: Secondary | ICD-10-CM | POA: Diagnosis not present

## 2016-08-10 DIAGNOSIS — Z8349 Family history of other endocrine, nutritional and metabolic diseases: Secondary | ICD-10-CM | POA: Diagnosis not present

## 2016-08-10 DIAGNOSIS — Z630 Problems in relationship with spouse or partner: Secondary | ICD-10-CM | POA: Diagnosis not present

## 2016-08-10 DIAGNOSIS — F331 Major depressive disorder, recurrent, moderate: Principal | ICD-10-CM | POA: Diagnosis present

## 2016-08-10 DIAGNOSIS — G4700 Insomnia, unspecified: Secondary | ICD-10-CM | POA: Diagnosis present

## 2016-08-10 DIAGNOSIS — Z79899 Other long term (current) drug therapy: Secondary | ICD-10-CM | POA: Diagnosis not present

## 2016-08-10 DIAGNOSIS — Z87442 Personal history of urinary calculi: Secondary | ICD-10-CM | POA: Diagnosis not present

## 2016-08-10 DIAGNOSIS — E039 Hypothyroidism, unspecified: Secondary | ICD-10-CM | POA: Diagnosis present

## 2016-08-10 DIAGNOSIS — Z885 Allergy status to narcotic agent status: Secondary | ICD-10-CM

## 2016-08-10 DIAGNOSIS — Z882 Allergy status to sulfonamides status: Secondary | ICD-10-CM | POA: Diagnosis not present

## 2016-08-10 DIAGNOSIS — Z598 Other problems related to housing and economic circumstances: Secondary | ICD-10-CM

## 2016-08-10 DIAGNOSIS — Z8619 Personal history of other infectious and parasitic diseases: Secondary | ICD-10-CM

## 2016-08-10 DIAGNOSIS — F332 Major depressive disorder, recurrent severe without psychotic features: Secondary | ICD-10-CM | POA: Diagnosis present

## 2016-08-10 LAB — URINALYSIS, ROUTINE W REFLEX MICROSCOPIC
BILIRUBIN URINE: NEGATIVE
Glucose, UA: NEGATIVE mg/dL
HGB URINE DIPSTICK: NEGATIVE
Ketones, ur: NEGATIVE mg/dL
Nitrite: NEGATIVE
Protein, ur: NEGATIVE mg/dL
SPECIFIC GRAVITY, URINE: 1.026 (ref 1.005–1.030)
pH: 5 (ref 5.0–8.0)

## 2016-08-10 LAB — COMPREHENSIVE METABOLIC PANEL
ALT: 18 U/L (ref 14–54)
AST: 17 U/L (ref 15–41)
Albumin: 4.2 g/dL (ref 3.5–5.0)
Alkaline Phosphatase: 90 U/L (ref 38–126)
Anion gap: 7 (ref 5–15)
BUN: 11 mg/dL (ref 6–20)
CHLORIDE: 107 mmol/L (ref 101–111)
CO2: 25 mmol/L (ref 22–32)
Calcium: 9.3 mg/dL (ref 8.9–10.3)
Creatinine, Ser: 0.9 mg/dL (ref 0.44–1.00)
Glucose, Bld: 106 mg/dL — ABNORMAL HIGH (ref 65–99)
POTASSIUM: 3.5 mmol/L (ref 3.5–5.1)
Sodium: 139 mmol/L (ref 135–145)
Total Bilirubin: 0.4 mg/dL (ref 0.3–1.2)
Total Protein: 8.2 g/dL — ABNORMAL HIGH (ref 6.5–8.1)

## 2016-08-10 LAB — CBC
HCT: 40.9 % (ref 36.0–46.0)
Hemoglobin: 13.4 g/dL (ref 12.0–15.0)
MCH: 29.6 pg (ref 26.0–34.0)
MCHC: 32.8 g/dL (ref 30.0–36.0)
MCV: 90.5 fL (ref 78.0–100.0)
PLATELETS: 359 10*3/uL (ref 150–400)
RBC: 4.52 MIL/uL (ref 3.87–5.11)
RDW: 13.9 % (ref 11.5–15.5)
WBC: 14.9 10*3/uL — AB (ref 4.0–10.5)

## 2016-08-10 LAB — TSH: TSH: 2.715 u[IU]/mL (ref 0.350–4.500)

## 2016-08-10 MED ORDER — PNEUMOCOCCAL VAC POLYVALENT 25 MCG/0.5ML IJ INJ
0.5000 mL | INJECTION | INTRAMUSCULAR | Status: AC
  Administered 2016-08-11: 0.5 mL via INTRAMUSCULAR

## 2016-08-10 MED ORDER — BUPROPION HCL ER (XL) 150 MG PO TB24
150.0000 mg | ORAL_TABLET | Freq: Every day | ORAL | Status: DC
  Administered 2016-08-11: 150 mg via ORAL
  Filled 2016-08-10 (×4): qty 1

## 2016-08-10 MED ORDER — ALUM & MAG HYDROXIDE-SIMETH 200-200-20 MG/5ML PO SUSP: 30.0000 mL | ORAL | Status: DC | PRN

## 2016-08-10 MED ORDER — LEVOTHYROXINE SODIUM 150 MCG PO TABS
150.0000 ug | ORAL_TABLET | Freq: Every day | ORAL | Status: DC
  Administered 2016-08-11 – 2016-08-15 (×5): 150 ug via ORAL
  Filled 2016-08-10 (×6): qty 1
  Filled 2016-08-10: qty 2
  Filled 2016-08-10: qty 1

## 2016-08-10 MED ORDER — INFLUENZA VAC SPLIT QUAD 0.5 ML IM SUSY
0.5000 mL | PREFILLED_SYRINGE | INTRAMUSCULAR | Status: AC
  Administered 2016-08-11: 0.5 mL via INTRAMUSCULAR
  Filled 2016-08-10: qty 0.5

## 2016-08-10 MED ORDER — MAGNESIUM HYDROXIDE 400 MG/5ML PO SUSP: 30.0000 mL | Freq: Every day | ORAL | Status: DC | PRN

## 2016-08-10 MED ORDER — DULOXETINE HCL 60 MG PO CPEP
60.0000 mg | ORAL_CAPSULE | Freq: Every day | ORAL | Status: DC
  Administered 2016-08-11 – 2016-08-15 (×5): 60 mg via ORAL
  Filled 2016-08-10 (×8): qty 1

## 2016-08-10 MED ORDER — ACETAMINOPHEN 325 MG PO TABS
650.0000 mg | ORAL_TABLET | Freq: Four times a day (QID) | ORAL | Status: DC | PRN
Start: 2016-08-10 — End: 2016-08-16
  Administered 2016-08-10 – 2016-08-14 (×3): 650 mg via ORAL
  Filled 2016-08-10 (×3): qty 2

## 2016-08-10 MED ORDER — TRAZODONE HCL 50 MG PO TABS: 50.0000 mg | ORAL_TABLET | Freq: Every evening | ORAL | Status: DC | PRN

## 2016-08-10 MED ORDER — HYDROXYZINE HCL 25 MG PO TABS
25.0000 mg | ORAL_TABLET | Freq: Four times a day (QID) | ORAL | Status: DC | PRN
  Administered 2016-08-10 – 2016-08-14 (×5): 25 mg via ORAL
  Filled 2016-08-10 (×5): qty 1
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
## 2016-08-10 NOTE — Tx Team (Signed)
Initial Treatment Plan 08/10/2016 6:37 PM Catherine [name] [id number]    PATIENT STRESSORS: Financial difficulties Marital or family conflict   PATIENT STRENGTHS: Ability for insight Active sense of humor Average or above average intelligence Capable of independent living Occupational psychologist fund of knowledge Motivation for treatment/growth Physical Health Supportive family/friends   PATIENT IDENTIFIED PROBLEMS: "How to process depression."  "How to not let stuff in the past haunt me."                   DISCHARGE CRITERIA:  Ability to meet basic life and health needs Improved stabilization in mood, thinking, and/or behavior  PRELIMINARY DISCHARGE PLAN: Outpatient therapy  PATIENT/FAMILY INVOLVEMENT: This treatment plan has been presented to and reviewed with the patient, Catherine [name].  The patient and family have been given the opportunity to ask questions and make suggestions.  Milbert Coulter, RN 08/10/2016, 6:37 PM

## 2016-08-10 NOTE — Progress Notes (Signed)
Patient attended group and said she had just arrived. Her day was a 3.

## 2016-08-10 NOTE — Progress Notes (Signed)
D: Pt was in the hallway upon initial approach.  Pt presents with depressed, anxious affect and mood.  When asked how she is feeling, pt states "I've been better, I'm nervous."  Developed plan with pt to "be safe, get some rest."  Pt denies SI/HI, denies hallucinations, reports pain from headache of 5/10.  Pt has stayed in her room for the majority of the night, as she is adjusting to the unit.  Pt attended evening group.  A: Introduced self to pt.  Actively listened to pt and offered support and encouragement. PRN medication administered for anxiety and pain.  Q15 minute safety checks maintained.  Order obtained for CPAP from on-site provider.  Pt provided with CPAP.  She agrees to return it to staff in the morning so it can be stored in the medication room when not in use.    R: Pt is safe on the unit.  Pt is compliant with medications.  Pt verbally contracts for safety.  Will continue to monitor and assess.
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
## 2016-08-10 NOTE — H&P (Signed)
Behavioral Health Medical Screening Exam  Catherine [name] is an 41 y.o. female.  Total Time spent with patient: 15 minutes  Psychiatric Specialty Exam: Physical Exam  Constitutional: She is oriented to person, place, and time. She appears well-developed and well-nourished.  HENT:  Head: Normocephalic.  Right Ear: External ear normal.  Left Ear: External ear normal.  Neck: Normal range of motion.  Cardiovascular: Normal rate and normal heart sounds.   Respiratory: Effort normal and breath sounds normal.  GI: Soft. Bowel sounds are normal.  Musculoskeletal: Normal range of motion.  Neurological: She is alert and oriented to person, place, and time.  Skin: Skin is warm and dry.    Review of Systems  Psychiatric/Behavioral: Positive for depression and suicidal ideas. Negative for hallucinations, memory loss and substance abuse. The patient is nervous/anxious. The patient does not have insomnia.   All other systems reviewed and are negative.   Blood pressure (!) 137/100, pulse 95, temperature 98.6 F (37 C), temperature source Oral, resp. rate 18, last menstrual period 09/18/2013, SpO2 98 %.There is no height or weight on file to calculate BMI.  General Appearance: Casual and Fairly Groomed  Eye Contact:  Good  Speech:  Clear and Coherent and Normal Rate  Volume:  Normal  Mood:  Anxious and Depressed  Affect:  Congruent, Depressed and Tearful  Thought Process:  Coherent, Goal Directed and Linear  Orientation:  Full (Time, Place, and Person)  Thought Content:  Logical  Suicidal Thoughts:  Yes.  without intent/plan  Homicidal Thoughts:  No  Memory:  Immediate;   Good Recent;   Good Remote;   Fair  Judgement:  Fair  Insight:  Fair  Psychomotor Activity:  Normal  Concentration: Concentration: Good  Recall:  Good  Fund of Knowledge:Good  Language: Good  Akathisia:  No  Handed:  Right  AIMS (if indicated):     Assets:  Communication Skills Desire for Improvement Financial  Resources/Insurance Housing Physical Health Resilience Social Support Transportation  Sleep:       Musculoskeletal: Strength & Muscle Tone: within normal limits Gait & Station: normal Patient leans: N/A  Blood pressure (!) 137/100, pulse 95, temperature 98.6 F (37 C), temperature source Oral, resp. rate 18, last menstrual period 09/18/2013, SpO2 98 %.  Recommendations:  Based on my evaluation the patient does not appear to have an emergency medical condition.  Ethelene Hal, NP 08/10/2016, 4:25 PM

## 2016-08-10 NOTE — Plan of Care (Signed)
Problem: Coping: Goal: Ability to demonstrate self-control will improve Outcome: Progressing Pt has remained in control of her behavior tonight.

## 2016-08-11 DIAGNOSIS — R45851 Suicidal ideations: Secondary | ICD-10-CM

## 2016-08-11 MED ORDER — METRONIDAZOLE 500 MG PO TABS
500.0000 mg | ORAL_TABLET | Freq: Two times a day (BID) | ORAL | Status: DC
  Administered 2016-08-11: 500 mg via ORAL
  Filled 2016-08-11 (×5): qty 1

## 2016-08-11 MED ORDER — BUPROPION HCL ER (XL) 300 MG PO TB24
300.0000 mg | ORAL_TABLET | Freq: Every day | ORAL | Status: DC
  Administered 2016-08-12: 300 mg via ORAL
  Filled 2016-08-11 (×2): qty 1

## 2016-08-11 MED ORDER — NITROFURANTOIN MONOHYD MACRO 100 MG PO CAPS
100.0000 mg | ORAL_CAPSULE | Freq: Two times a day (BID) | ORAL | Status: DC
  Administered 2016-08-11 – 2016-08-15 (×8): 100 mg via ORAL
  Filled 2016-08-11 (×12): qty 1

## 2016-08-11 NOTE — [facility] Group Notes (Signed)
[employer] [facility] Notes:  Life Skills Group  Date:  08/11/2016  Time:  4:34 PM  Type of Therapy:  Psychoeducational Skills  Participation Level:  Active  Participation Quality:  Appropriate  Affect:  Appropriate  Cognitive:  Appropriate  Insight:  Improving  Engagement in Group:  Engaged  Modes of Intervention:  Discussion and Education  Summary of Progress/Problems: Patient attended group, participated, and was engaged.   Gaylan Gerold E 08/11/2016, 4:34 PM
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
## 2016-08-11 NOTE — Progress Notes (Signed)
D   Pt signed her 58 hour request for discharge this evening   She denies any need to be here   She is somewhat iirritable and anxious    She does socialize somewhat with her peers and her behavior is appropriate A   Verbal support given    Medications administered and effectiveness monitored    Q 15 min checks  R   Pt is safe at present and receptive to verbal support
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
## 2016-08-11 NOTE — [facility] Group Notes (Signed)
Adult Therapy Group Note  Date:  08/11/2016  Time:  10:00-11:00AM  Group Topic/Focus: Fears and Healthy/Unhealthy Coping Skills  Building Self Esteem:   The Focus of this group was to discuss some of the prevalent fears that patients experience, and to list some unhealthy coping and healthy coping techniques to deal with each fear, as well as supports that could help in using healthy coping.  This included a variety of supports, and CSW emphasized professional supports such as therapist, support groups and psychiatrist.  Several specific scenarios were reviewed, with suggestions for how to go about actually implementing the healthier coping technique(s).  Additional Comments:  The patient was late to group so did not have the opportunity to name a specific coping technique she wants to learn.  However, she participated in the group discussion when called on directly.  Participation Level:  Active  Participation Quality:  Attentive  Affect:  Blunted and Depressed  Cognitive:  Oriented  Insight: Improving  Engagement in Group:  Improving  Modes of Intervention:  Discussion, Exploration   Selmer Dominion, LCSW 08/11/2016   12:35pm

## 2016-08-11 NOTE — Progress Notes (Signed)
Patient ID: Catherine [name], female   DOB: [birth year], 41 y.o.   MRN: [id number]  DAR: Pt. Denies SI/HI and A/V Hallucinations. She reports she is able to contract for safety if she begins to feel unsafe. She reports sleep is fair, appetite is fair, energy level is low, and concentration is poor. She rates depression 3/10, hopelessness 5/10, and anxiety 7/10. Patient does report a headache this morning and received PRN medication. Her BP has been elevated but patient denies dizziness, blurry vision, nausea, and double vision. She reports a physician in the past has told her that her BP was elevated but did not report any medication that she has been on for it. Support and encouragement provided to the patient. Scheduled medications administered to patient per physician's orders. Patient received her pneumococcal and influenza vaccinations and tolerated well. EKG was also performed and patient was cooperative in this process. She is seen in the milieu interacting with her peers appropriately. Q15 minute checks are maintained for safety.

## 2016-08-11 NOTE — Progress Notes (Signed)
Patient attended group and said that her day was a 6.  She was excited to received a picture of her baby

## 2016-08-12 ENCOUNTER — Ambulatory Visit (HOSPITAL_COMMUNITY): Payer: Self-pay | Admitting: Clinical

## 2016-08-12 DIAGNOSIS — F17210 Nicotine dependence, cigarettes, uncomplicated: Secondary | ICD-10-CM

## 2016-08-12 DIAGNOSIS — F331 Major depressive disorder, recurrent, moderate: Principal | ICD-10-CM

## 2016-08-12 DIAGNOSIS — Z79899 Other long term (current) drug therapy: Secondary | ICD-10-CM

## 2016-08-12 LAB — HEMOGLOBIN A1C
Hgb A1c MFr Bld: 5.4 % (ref 4.8–5.6)
Mean Plasma Glucose: 108 mg/dL

## 2016-08-12 MED ORDER — BUPROPION HCL ER (XL) 150 MG PO TB24
150.0000 mg | ORAL_TABLET | Freq: Every day | ORAL | Status: DC
  Administered 2016-08-13 – 2016-08-15 (×3): 150 mg via ORAL
  Filled 2016-08-12 (×5): qty 1

## 2016-08-12 NOTE — Plan of Care (Signed)
Problem: Safety: Goal: Periods of time without injury will increase Outcome: Progressing Pt safe on the unit at this time   

## 2016-08-12 NOTE — Plan of Care (Signed)
Problem: Activity: Goal: Sleeping patterns will improve Outcome: Progressing Per documentation- patient slept 6.75 hours last night.

## 2016-08-12 NOTE — [facility] Group Notes (Signed)
[place] LCSW Group Therapy Note  08/12/2016 1:15pm  Type of Therapy and Topic:  Group Therapy:  Trust and Honesty  Participation Level:  Active  Description of Group:    In this group patients will be asked to explore value of being honest.  Patients will be guided to discuss their thoughts, feelings, and behaviors related to honesty and trusting in others. Patients will process together how trust and honesty relate to how we form relationships with peers, family members, and self.  Patients will be challenged to reflect on past experiences and how the past impacts their ability to trust and be honest with others.  Each patient will be challenged to identify and express feelings of being vulnerable. Patients will discuss reasons why people are dishonest, barriers to being honest with self and others, and will identify alternative outcomes if one was truthful (to self or others).  Patient will process possible risks and benefits for being honest. This group will be process-oriented, with patients participating in exploration of their own experiences as well as giving and receiving support and challenge from other group members.  Therapeutic Goals: 1. Patient will identify why honesty is important to relationships and how honesty overall affects relationships.  2. Patient will identify a situation where they lied or were lied too and the  feelings, thought process, and behaviors surrounding the situation 3. Patient will identify the meaning of being vulnerable, how that feels, and how that correlates to being honest with self and others. 4. Patient will identify situations where they could have told the truth, but instead lied and explain reasons of dishonesty.  Summary of Patient Progress Pt identified an inability to be honest with her husband regarding her past due to his tendency to "hold it against her." Pt reports that she has difficulty accepting compliments or other positive statements and focuses  on her mistakes.    Therapeutic Modalities:   Cognitive Behavioral Therapy Solution Focused Therapy Motivational Interviewing Brief Therapy   Adriana Reams, LCSW 08/12/2016 4:25 PM

## 2016-08-12 NOTE — Progress Notes (Signed)
[facility] MD Progress Note  08/12/2016 2:33 PM NOLENE ROCKS  MRN:  [id number] Subjective:  Patient reports she feels " I am OK" today . She denies any active SI. She tends to ruminate about her stressors, but states " I think I will be OK". Reports she came to the hospital mainly because she felt her husband ( currently separated ) was pressuring her to do so, but states " honestly I don't think I needed to come inpatient ". Denies medication side effects. Objective : I have discussed case with treatment team and have met with patient . Patient is a 41 year old female, presented for depression, passive SI, in the context of significant stressors, mainly going through separation. At this time denies any suicidal or self injurious ideations. No disruptive or agitated behaviors on unit, limited milieu participation. She is focused on being discharged soon, and states she is missing her family and work- at this time she is living with a sibling . She is employed, and states she enjoys her work and coworkers . Denies medication side effects- currently on Wellbutrin and Cymbalta.   Principal Problem: Major depressive disorder, recurrent episode, moderate ([place]) Diagnosis:   Patient Active Problem List   Diagnosis Date Noted  . MDD (major depressive disorder), recurrent severe, without psychosis ([place]) [F33.2] 08/10/2016  . Major depressive disorder, recurrent episode, moderate ([place]) [F33.1] 08/02/2016  . Migraine with status migrainosus [G43.901] 02/24/2014  . Cholelithiasis with acute cholecystitis [K80.00] 11/26/2013  . GERD (gastroesophageal reflux disease) [K21.9] 11/10/2013  . Vaginitis and vulvovaginitis, unspecified [N76.0] 11/10/2013  . S/P hysterectomy [Z90.710] 10/06/2013  . Dyspareunia [IMO0002] 09/21/2013  . Excessive or frequent menstruation [N92.0] 09/21/2013   Total Time spent with patient: 20 minutes  Past Medical History:  Past Medical History:  Diagnosis Date  . Allergic rhinitis    . Anxiety   . Deviated septum   . FHx: allergies   . GERD (gastroesophageal reflux disease)   . H/O scarlet fever   . Hiatal hernia   . History of kidney stones   . Hypertension   . Hypothyroidism   . Kidney stones     Past Surgical History:  Procedure Laterality Date  . ABDOMINAL HYSTERECTOMY N/A 10/06/2013   Procedure: HYSTERECTOMY ABDOMINAL;  Surgeon: Florian Buff, MD;  Location: [facility];  Service: Gynecology;  Laterality: N/A;  . BILATERAL SALPINGECTOMY Bilateral 10/06/2013   Procedure: BILATERAL SALPINGECTOMY;  Surgeon: Florian Buff, MD;  Location: [facility];  Service: Gynecology;  Laterality: Bilateral;  . BLADDER SURGERY     reports had bladder stretched as child  . CHOLECYSTECTOMY N/A 11/26/2013   Procedure: LAPAROSCOPIC CHOLECYSTECTOMY;  Surgeon: Scherry Ran, MD;  Location: [facility];  Service: General;  Laterality: N/A;  . WISDOM TOOTH EXTRACTION     Family History:  Family History  Problem Relation Age of Onset  . Heart disease Father   . Hypertension Father   . Cancer Father   . Cirrhosis Father   . Thyroid disease Maternal Grandmother   . Cancer Maternal Grandmother   . Diabetes Maternal Grandfather   . Hypertension Maternal Grandfather   . Cancer Maternal Aunt   . Thyroid disease Maternal Aunt   . Cancer Paternal Aunt   . Migraines Neg Hx    Social History:  History  Alcohol Use No    Comment: 08-02-2016 per pt do no     History  Drug Use No    Comment: 08-02-2016 per pt no  Social History   Social History  . Marital status: Married    Spouse name: [name]   . Number of children: 1  . Years of education: 57   Occupational History  . [place] History Main Topics  . Smoking status: Current Every Day Smoker    Packs/day: 0.25    Years: 19.00    Types: Cigarettes    Last attempt to quit: 10/08/2011  . Smokeless tobacco: Never Used  . Alcohol use No     Comment: 08-02-2016 per pt do no  . Drug use: No     Comment:  08-02-2016 per pt no  . Sexual activity: Yes   Other Topics Concern  . None   Social History Narrative   Patient is a non smoker.   Patient works full-time.   Patient is married.    Patient has a high school education.    Patient works at [employer]    Caffeine: 60+ oz a day    Additional Social History:    Pain Medications: pt denies any alcohol or drug use Prescriptions: pt denies History of alcohol / drug use?: No history of alcohol / drug abuse  Sleep: Good  Appetite:  Good  Current Medications: Current Facility-Administered Medications  Medication Dose Route Frequency Provider Last Rate Last Dose  . acetaminophen (TYLENOL) tablet 650 mg  650 mg Oral Q6H PRN Ethelene Hal, NP   650 mg at 08/11/16 0809  . alum & mag hydroxide-simeth (MAALOX/MYLANTA) 200-200-20 MG/5ML suspension 30 mL  30 mL Oral Q4H PRN Ethelene Hal, NP      . Derrill Memo ON 08/13/2016] buPROPion (WELLBUTRIN XL) 24 hr tablet 150 mg  150 mg Oral Daily Hitoshi Werts A Tahesha Skeet, MD      . DULoxetine (CYMBALTA) DR capsule 60 mg  60 mg Oral Daily Ethelene Hal, NP   60 mg at 08/12/16 0817  . hydrOXYzine (ATARAX/VISTARIL) tablet 25 mg  25 mg Oral Q6H PRN Ethelene Hal, NP   25 mg at 08/11/16 2134  . levothyroxine (SYNTHROID, LEVOTHROID) tablet 150 mcg  150 mcg Oral QAC breakfast Ethelene Hal, NP   150 mcg at 08/12/16 [id number]  . magnesium hydroxide (MILK OF MAGNESIA) suspension 30 mL  30 mL Oral Daily PRN Ethelene Hal, NP      . nitrofurantoin (macrocrystal-monohydrate) (MACROBID) capsule 100 mg  100 mg Oral Q12H Derrill Center, NP   100 mg at 08/12/16 0817  . traZODone (DESYREL) tablet 50 mg  50 mg Oral QHS PRN Ethelene Hal, NP        Lab Results:  Results for orders placed or performed during the hospital encounter of 08/10/16 (from the past 48 hour(s))  CBC     Status: Abnormal   Collection Time: 08/10/16  6:17 PM  Result Value Ref Range   WBC 14.9 (H) 4.0 - 10.5 K/uL   RBC 4.52  3.87 - 5.11 MIL/uL   Hemoglobin 13.4 12.0 - 15.0 g/dL   HCT 40.9 36.0 - 46.0 %   MCV 90.5 78.0 - 100.0 fL   MCH 29.6 26.0 - 34.0 pg   MCHC 32.8 30.0 - 36.0 g/dL   RDW 13.9 11.5 - 15.5 %   Platelets 359 150 - 400 K/uL    Comment: Performed at [facility], [place] [address] [place], [place] [address]  Comprehensive metabolic panel     Status: Abnormal   Collection Time: 08/10/16  6:17 PM  Result Value Ref Range   Sodium 139 135 - 145 mmol/L   Potassium 3.5 3.5 - 5.1 mmol/L   Chloride 107 101 - 111 mmol/L   CO2 25 22 - 32 mmol/L   Glucose, Bld 106 (H) 65 - 99 mg/dL   BUN 11 6 - 20 mg/dL   Creatinine, Ser 0.90 0.44 - 1.00 mg/dL   Calcium 9.3 8.9 - 10.3 mg/dL   Total Protein 8.2 (H) 6.5 - 8.1 g/dL   Albumin 4.2 3.5 - 5.0 g/dL   AST 17 15 - 41 U/L   ALT 18 14 - 54 U/L   Alkaline Phosphatase 90 38 - 126 U/L   Total Bilirubin 0.4 0.3 - 1.2 mg/dL   GFR calc non Af Amer >60 >60 mL/min   GFR calc Af Amer >60 >60 mL/min    Comment: (NOTE) The eGFR has been calculated using the CKD EPI equation. This calculation has not been validated in all clinical situations. eGFR's persistently <60 mL/min signify possible Chronic Kidney Disease.    Anion gap 7 5 - 15    Comment: Performed at [facility], [place] [address], [place], [place] [address]  Hemoglobin A1c     Status: None   Collection Time: 08/10/16  6:17 PM  Result Value Ref Range   Hgb A1c MFr Bld 5.4 4.8 - 5.6 %    Comment: (NOTE)         Pre-diabetes: 5.7 - 6.4         Diabetes: >6.4         Glycemic control for adults with diabetes: <7.0    Mean Plasma Glucose 108 mg/dL    Comment: (NOTE) Performed At: [facility] [place], [place] [id number] Lindon Romp MD [id number] Performed at [facility], [place] [address], [place], [place] [address]   TSH     Status: None   Collection Time: 08/10/16  6:17 PM  Result Value Ref Range   TSH 2.715 0.350 -  4.500 uIU/mL    Comment: Performed by a 3rd Generation assay with a functional sensitivity of <=0.01 uIU/mL. Performed at [facility], [place] [address] [place], [place] [address]   Urinalysis, Routine w reflex microscopic     Status: Abnormal   Collection Time: 08/10/16  6:27 PM  Result Value Ref Range   Color, Urine YELLOW YELLOW   APPearance HAZY (A) CLEAR   Specific Gravity, Urine 1.026 1.005 - 1.030   pH 5.0 5.0 - 8.0   Glucose, UA NEGATIVE NEGATIVE mg/dL   Hgb urine dipstick NEGATIVE NEGATIVE   Bilirubin Urine NEGATIVE NEGATIVE   Ketones, ur NEGATIVE NEGATIVE mg/dL   Protein, ur NEGATIVE NEGATIVE mg/dL   Nitrite NEGATIVE NEGATIVE   Leukocytes, UA TRACE (A) NEGATIVE   RBC / HPF 0-5 0 - 5 RBC/hpf   WBC, UA 6-30 0 - 5 WBC/hpf   Bacteria, UA MANY (A) NONE SEEN   Squamous Epithelial / LPF 6-30 (A) NONE SEEN   Mucous PRESENT    Ca Oxalate Crys, UA PRESENT     Comment: Performed at [facility], [place] [address], [place], [place] [address]    Blood Alcohol level:  No results found for: [facility]  Metabolic Disorder Labs: Lab Results  Component Value Date   HGBA1C 5.4 08/10/2016   MPG 108 08/10/2016   No results found for: PROLACTIN No results found for: CHOL, TRIG, HDL, CHOLHDL, VLDL, LDLCALC  Physical Findings: AIMS:  , ,  ,  ,  CIWA:    COWS:     Musculoskeletal: Strength & Muscle Tone: within normal limits Gait & Station: normal Patient leans: N/A  Psychiatric Specialty Exam: Physical Exam  ROS denies chest pain, denies shortness of breath, no vomiting  Blood pressure 133/84, pulse 87, temperature 97.9 F (36.6 C), temperature source Oral, resp. rate 18, height 5' 9" (1.753 m), weight 117.5 kg (259 lb), SpO2 100 %.Body mass index is 38.25 kg/m.  General Appearance: Well Groomed  Eye Contact:  Good  Speech:  Normal Rate  Volume:  Normal  Mood:  states she is feeling better today  Affect:  appropriate, smiles at times  appropriately   Thought Process:  Linear  Orientation:  Full (Time, Place, and Person)  Thought Content:  denies hallucinations, no delusions, not internally preoccupied   Suicidal Thoughts:  No  Homicidal Thoughts:  No  Memory:  recent and remote grossly intact   Judgement:  Other:  improving   Insight:  improving   Psychomotor Activity:  Normal  Concentration:  Concentration: Good and Attention Span: Good  Recall:  Good  Fund of Knowledge:  Good  Language:  Good  Akathisia:  Negative  Handed:  Right  AIMS (if indicated):     Assets:  Communication Skills Desire for Improvement Resilience  ADL's:  Intact  Cognition:  WNL  Sleep:  Number of Hours: 6.75   Assessment - patient is reporting she is feeling better, affect is reactive. Denies suicidal ideations. She is future oriented. She is tolerating medications well at this time- of note, is on combination of Cymbalta and Wellbutrin XL- we reviewed potential drug drug interactions - she denies tachycardia, and BP is WNL. She has no history of seizures .    Treatment Plan Summary: Daily contact with patient to assess and evaluate symptoms and progress in treatment, Medication management, Plan inpatient treatment  and medications as below  Encourage group and milieu participation to work on coping skills and symptom reduction  Continue Wellbutrin XL 150 mgrs QDAY for depression Continue Cymbalta 60 mgrs QDAY for depression and anxiety Continue Synthroid for history of hypothyroidism  Continue Trazodone 50 mgrs QHS PRN for insomnia Continue Vistaril 25 mgrs Q 6 hours PRN for anxiety Treatment team working on disposition planning options   Neita Garnet, MD 08/12/2016, 2:33 PM

## 2016-08-12 NOTE — Progress Notes (Signed)
Recreation Therapy Notes  Date: 08/12/16 Time: 0930 Location: [address] Dayroom  Group Topic: Stress Management  Goal Area(s) Addresses:  Patient will verbalize importance of using healthy stress management.  Patient will identify positive emotions associated with healthy stress management.   Intervention: Stress Management  Activity :  Guided Visualization.  LRT introduced the stress management technique of guided visualization.  LRT read a script to allow patient to follow along and engage in the activity.  Patients were to follow along at LRT read script to engage in the activity.  Education:  Stress Management, Discharge Planning.   Education Outcome: Acknowledges edcuation/In group clarification offered/Needs additional education  Clinical Observations/Feedback: Pt did not attend group.    Victorino Sparrow, LRT/CTRS         Victorino Sparrow A 08/12/2016 12:56 PM
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
## 2016-08-12 NOTE — Progress Notes (Signed)
Patient ID: Catherine [name], female   DOB: 1975-11-04, 41 y.o.   MRN: [id number]  DAR: Pt. Denies SI/HI and A/V Hallucinations. She reports sleep is fair, appetite is fair, energy level is low, and concentration is good. She rates depression 5/10, hopelessness 7/10, and anxiety 8/10. Patient does report pain in her lower back and requested a heat pack which she received. Support and encouragement provided to the patient to come to staff if she needs anything or she begins to feel unsafe. She reports that she wants to go home. She is pleasant but minimal during interaction with this Probation officer. Scheduled medications administered to patient per physician's orders. Patient is cooperative and seen in the milieu minimally. Q15 minute checks are maintained for safety.

## 2016-08-12 NOTE — Tx Team (Signed)
Interdisciplinary Treatment and Diagnostic Plan Update  08/12/2016 Time of Session: 9:30am Catherine [name] MRN: [id number]  Principal Diagnosis: Major depressive disorder, recurrent episode, moderate ([name])  Secondary Diagnoses: Principal Problem:   Major depressive disorder, recurrent episode, moderate ([name]) Active Problems:   MDD (major depressive disorder), recurrent severe, without psychosis ([name])   Current Medications:  Current Facility-Administered Medications  Medication Dose Route Frequency Provider Last Rate Last Dose  . acetaminophen (TYLENOL) tablet 650 mg  650 mg Oral Q6H PRN Ethelene Hal, NP   650 mg at 08/11/16 0809  . alum & mag hydroxide-simeth (MAALOX/MYLANTA) 200-200-20 MG/5ML suspension 30 mL  30 mL Oral Q4H PRN Ethelene Hal, NP      . Derrill Memo ON 08/13/2016] buPROPion (WELLBUTRIN XL) 24 hr tablet 150 mg  150 mg Oral Daily [name], MD      . DULoxetine (CYMBALTA) DR capsule 60 mg  60 mg Oral Daily Ethelene Hal, NP   60 mg at 08/12/16 0817  . hydrOXYzine (ATARAX/VISTARIL) tablet 25 mg  25 mg Oral Q6H PRN Ethelene Hal, NP   25 mg at 08/11/16 2134  . levothyroxine (SYNTHROID, LEVOTHROID) tablet 150 mcg  150 mcg Oral QAC breakfast Ethelene Hal, NP   150 mcg at 08/12/16 [id number]  . magnesium hydroxide (MILK OF MAGNESIA) suspension 30 mL  30 mL Oral Daily PRN Ethelene Hal, NP      . nitrofurantoin (macrocrystal-monohydrate) (MACROBID) capsule 100 mg  100 mg Oral Q12H Derrill Center, NP   100 mg at 08/12/16 0817  . traZODone (DESYREL) tablet 50 mg  50 mg Oral QHS PRN Ethelene Hal, NP        PTA Medications: Facility-Administered Medications Prior to Admission  Medication Dose Route Frequency Provider Last Rate Last Dose  . [DISCONTINUED] methylPREDNISolone sodium succinate (SOLU-MEDROL) 250 mg in sodium chloride 0.9 % 100 mL IVPB  250 mg Intravenous Continuous [name], MD   250 mg at 03/08/14 1440  .  [DISCONTINUED] valproate (DEPACON) 1,000 mg in sodium chloride 0.9 % 100 mL IVPB  1,000 mg Intravenous Continuous [name], MD   Stopped at 03/08/14 1517  . [DISCONTINUED] valproate (DEPACON) 500 mg in sodium chloride 0.9 % 100 mL IVPB  500 mg Intravenous Continuous [name], MD 105 mL/hr at 03/08/14 1426 500 mg at 03/08/14 1426   Prescriptions Prior to Admission  Medication Sig Dispense Refill Last Dose  . buPROPion (WELLBUTRIN XL) 150 MG 24 hr tablet Take 1 tablet (150 mg total) by mouth daily. 30 tablet 1 08/10/2016 at Unknown time  . DULoxetine (CYMBALTA) 60 MG capsule Take 1 capsule (60 mg total) by mouth daily. 30 capsule 1 08/10/2016 at Unknown time  . levothyroxine (SYNTHROID, LEVOTHROID) 150 MCG tablet Take 150 mcg by mouth daily before breakfast.    Past Week at Unknown time  . LORazepam (ATIVAN) 0.5 MG tablet Take 1 tablet (0.5 mg total) by mouth daily as needed for anxiety. 30 tablet 0 Past Month at Unknown time    Treatment Modalities: Medication Management, Group therapy, Case management,  1 to 1 session with clinician, Psychoeducation, Recreational therapy.  Patient Stressors: Financial difficulties Marital or family conflict  Patient Strengths: Ability for insight Active sense of humor Average or above average intelligence Capable of independent living Occupational psychologist fund of knowledge Motivation for treatment/growth Physical Health Supportive family/friends  Physician Treatment Plan for Primary Diagnosis: Major depressive disorder, recurrent episode, moderate ([place])  Long Term Goal(s): Improvement in symptoms so as ready for discharge  Short Term Goals: Ability to identify changes in lifestyle to reduce recurrence of condition will improve Ability to verbalize feelings will improve Ability to identify and develop effective coping behaviors will improve Ability to identify triggers associated with substance abuse/mental health  issues will improve Ability to identify changes in lifestyle to reduce recurrence of condition will improve Ability to verbalize feelings will improve Ability to demonstrate self-control will improve  Medication Management: Evaluate patient's response, side effects, and tolerance of medication regimen.  Therapeutic Interventions: 1 to 1 sessions, Unit Group sessions and Medication administration.  Evaluation of Outcomes: Not Met  Physician Treatment Plan for Secondary Diagnosis: Principal Problem:   Major depressive disorder, recurrent episode, moderate ([facility]) Active Problems:   MDD (major depressive disorder), recurrent severe, without psychosis ([place])   Long Term Goal(s): Improvement in symptoms so as ready for discharge  Short Term Goals: Ability to identify changes in lifestyle to reduce recurrence of condition will improve Ability to verbalize feelings will improve Ability to identify and develop effective coping behaviors will improve Ability to identify triggers associated with substance abuse/mental health issues will improve Ability to identify changes in lifestyle to reduce recurrence of condition will improve Ability to verbalize feelings will improve Ability to demonstrate self-control will improve  Medication Management: Evaluate patient's response, side effects, and tolerance of medication regimen.  Therapeutic Interventions: 1 to 1 sessions, Unit Group sessions and Medication administration.  Evaluation of Outcomes: Not Met   RN Treatment Plan for Primary Diagnosis: Major depressive disorder, recurrent episode, moderate ([facility]) Long Term Goal(s): Knowledge of disease and therapeutic regimen to maintain health will improve  Short Term Goals: Ability to verbalize feelings will improve, Ability to disclose and discuss suicidal ideas and Ability to identify and develop effective coping behaviors will improve  Medication Management: RN will administer medications as  ordered by provider, will assess and evaluate patient's response and provide education to patient for prescribed medication. RN will report any adverse and/or side effects to prescribing provider.  Therapeutic Interventions: 1 on 1 counseling sessions, Psychoeducation, Medication administration, Evaluate responses to treatment, Monitor vital signs and CBGs as ordered, Perform/monitor CIWA, COWS, AIMS and Fall Risk screenings as ordered, Perform wound care treatments as ordered.  Evaluation of Outcomes: Not Met   LCSW Treatment Plan for Primary Diagnosis: Major depressive disorder, recurrent episode, moderate ([place]) Long Term Goal(s): Safe transition to appropriate next level of care at discharge, Engage patient in therapeutic group addressing interpersonal concerns.  Short Term Goals: Engage patient in aftercare planning with referrals and resources, Identify triggers associated with mental health/substance abuse issues and Increase skills for wellness and recovery  Therapeutic Interventions: Assess for all discharge needs, 1 to 1 time with Social worker, Explore available resources and support systems, Assess for adequacy in community support network, Educate family and significant other(s) on suicide prevention, Complete Psychosocial Assessment, Interpersonal group therapy.  Evaluation of Outcomes: Not Met   Progress in Treatment: Attending groups: Yes Participating in groups: Yes Taking medication as prescribed: Yes, MD continues to assess for medication changes as needed Toleration medication: Yes, no side effects reported at this time Family/Significant other contact made: No, CSW assessing for appropriate contact Patient understands diagnosis: Continuing to assess Discussing patient identified problems/goals with staff: Yes Medical problems stabilized or resolved: Yes Denies suicidal/homicidal ideation: Yes Issues/concerns per patient self-inventory: None Other: N/A  New problem(s)  identified: None identified at this time.   New Short  Term/Long Term Goal(s): None identified at this time.   Discharge Plan or Barriers: Pt will return home and follow-up with outpatient services at [facility]  Reason for Continuation of Hospitalization: Anxiety Depression Medication stabilization   Estimated Length of Stay: 2-3 days  Attendees: Patient: 08/12/2016  1:07 PM  Physician: Dr. Parke Poisson 08/12/2016  1:07 PM  Nursing: Kerby Nora, RN; Gaylan Gerold, RN 08/12/2016  1:07 PM  RN Care Manager: Lars Pinks, RN 08/12/2016  1:07 PM  Social Worker: Adriana Reams, LCSW; Matthew Saras, [place] 08/12/2016  1:07 PM  Recreational Therapist:  08/12/2016  1:07 PM  Other: Ricky Ala, NP; Samuel Jester, NP 08/12/2016  1:07 PM  Other:  08/12/2016  1:07 PM  Other: 08/12/2016  1:07 PM    Scribe for Treatment Team: Gladstone Lighter, LCSW 08/12/2016 1:07 PM

## 2016-08-13 NOTE — [facility] Group Notes (Signed)
[facility] Group Notes:  (Nursing/MHT/Case Management/Adjunct)  Date:  08/13/2016  Time:  9:27 AM  Type of Therapy:  Nurse Education  Participation Level:  Active  Participation Quality:  Appropriate  Affect:  Appropriate  Cognitive:  Appropriate  Insight:  Appropriate  Engagement in Group:  Engaged  Modes of Intervention:  Discussion, Education and Support  Summary of Progress/Problems: Katanna said she did not feel like sharing a goal, but she listened attentively. She filled out her suicide safety plan, which we discussed in group, and turned in prior to the conclusion of group.  Marya Landry 08/13/2016, 9:27 AM

## 2016-08-13 NOTE — Progress Notes (Signed)
Recreation Therapy Notes  Animal-Assisted Activity (AAA) Program Checklist/Progress Notes Patient Eligibility Criteria Checklist & Daily Group note for Rec TxIntervention  Date: 03.06.2018 Time: 2:50pm Location: [address]    AAA/T Program Assumption of Risk Form signed by Patient/ or Parent Legal Guardian Yes  Patient is free of allergies or sever asthma Yes  Patient reports no fear of animals Yes  Patient reports no history of cruelty to animals Yes  Patient understands his/her participation is voluntary Yes  Patient washes hands before animal contact Yes  Patient washes hands after animal contact Yes  Behavioral Response: Appropriate   Education:Hand Washing, Appropriate Animal Interaction   Education Outcome: Acknowledges education.   Clinical Observations/Feedback: Patient attended session and interacted appropriately with therapy dog and peers.  Laureen Ochs Chris Narasimhan, LRT/CTRS       Mckinsley Koelzer L 08/13/2016 3:11 PM

## 2016-08-13 NOTE — BHH Group Notes (Signed)
[facility] LCSW Group Therapy 08/13/2016 1:15 PM  Type of Therapy: Group Therapy- Feelings about Diagnosis  Participation Level: Active   Participation Quality:  Appropriate  Affect:  Appropriate  Cognitive: Alert and Oriented   Insight:  Developing   Engagement in Therapy: Developing/Improving and Engaged   Modes of Intervention: Clarification, Confrontation, Discussion, Education, Exploration, Limit-setting, Orientation, Problem-solving, Rapport Building, Art therapist, Socialization and Support  Description of Group:   This group will allow patients to explore their thoughts and feelings about diagnoses they have received. Patients will be guided to explore their level of understanding and acceptance of these diagnoses. Facilitator will encourage patients to process their thoughts and feelings about the reactions of others to their diagnosis, and will guide patients in identifying ways to discuss their diagnosis with significant others in their lives. This group will be process-oriented, with patients participating in exploration of their own experiences as well as giving and receiving support and challenge from other group members.  Summary of Progress/Problems:  Pt participated actively in group discussion and reports that she finds interacting with people who do not understand her mental health concerns is discouraging and frustrating. Pt reports that her depression often causes her to feel alone in her disease, however she is able to process the irrationality of these thoughts as she knows that when she is feeling better she does not feel controlled by her depression.   Therapeutic Modalities:   Cognitive Behavioral Therapy Solution Focused Therapy Motivational Interviewing Relapse Prevention Therapy  Adriana Reams, LCSW 08/13/2016 4:38 PM

## 2016-08-13 NOTE — Progress Notes (Signed)
[facility] Group Notes:  (Nursing/MHT/Case Management/Adjunct)  Date:  08/13/2016  Time:  12:40 AM  Type of Therapy:  Psychoeducational Skills  Participation Level:  Active  Participation Quality:  Appropriate  Affect:  Appropriate  Cognitive:  Appropriate  Insight:  Appropriate  Engagement in Group:  Developing/Improving  Modes of Intervention:  Education  Summary of Progress/Problems: Patient shared in group that she had a good day and that she might be discharged on Tuesday. The patient's support system will be comprised of her sister and aunt.   Catherine [name] S 08/13/2016, 12:40 AM

## 2016-08-13 NOTE — Progress Notes (Signed)
[facility] MD Progress Note  08/13/2016 1:34 PM Catherine [name]  MRN:  [id number] Subjective:  Patient reports feeling partially better than on admission, and at this time reports her mood as "OK, I guess ". Denies suicidal ideations, denies medication side effects. She continues to ruminate about her family/relationship stressors .  Objective : I have discussed case with treatment team and have met with patient . As above, patient presents with partial improvement compared to admission, and reports she feels her mood is improved. Affect is appropriate, reactive, tends to be anxious. Denies suicidal ideations. Also, denies any violent or homicidal ideations, and in particular denies any violent or homicidal ideations towards her husband. She remains ruminative and focused on relationship stressors, reports she recently left husband and has been living with a family member over the last several days prior to admission. States she has wanted to separate, and feels she made right decision at this time. She denies physical domestic violence, but states husband has tended to be controlling , emotionally manipulative. States she is interested in having a family meeting with her and husband, because " he thinks that part of the problem we have in our relationship is my depression, but I don't think that it plays a part". States she feels depression is at least partly secondary to marital stressors rather than vice versa . Denies medication side effects. No disruptive or behavioral issues on unit, visible in day room, pleasant on approach.   Principal Problem: Major depressive disorder, recurrent episode, moderate ([place]) Diagnosis:   Patient Active Problem List   Diagnosis Date Noted  . MDD (major depressive disorder), recurrent severe, without psychosis ([place]) [F33.2] 08/10/2016  . Major depressive disorder, recurrent episode, moderate ([place]) [F33.1] 08/02/2016  . Migraine with status migrainosus [G43.901] 02/24/2014   . Cholelithiasis with acute cholecystitis [K80.00] 11/26/2013  . GERD (gastroesophageal reflux disease) [K21.9] 11/10/2013  . Vaginitis and vulvovaginitis, unspecified [N76.0] 11/10/2013  . S/P hysterectomy [Z90.710] 10/06/2013  . Dyspareunia [IMO0002] 09/21/2013  . Excessive or frequent menstruation [N92.0] 09/21/2013   Total Time spent with patient: 20 minutes  Past Medical History:  Past Medical History:  Diagnosis Date  . Allergic rhinitis   . Anxiety   . Deviated septum   . FHx: allergies   . GERD (gastroesophageal reflux disease)   . H/O scarlet fever   . Hiatal hernia   . History of kidney stones   . Hypertension   . Hypothyroidism   . Kidney stones     Past Surgical History:  Procedure Laterality Date  . ABDOMINAL HYSTERECTOMY N/A 10/06/2013   Procedure: HYSTERECTOMY ABDOMINAL;  Surgeon: Florian Buff, MD;  Location: [facility];  Service: Gynecology;  Laterality: N/A;  . BILATERAL SALPINGECTOMY Bilateral 10/06/2013   Procedure: BILATERAL SALPINGECTOMY;  Surgeon: Florian Buff, MD;  Location: [facility];  Service: Gynecology;  Laterality: Bilateral;  . BLADDER SURGERY     reports had bladder stretched as child  . CHOLECYSTECTOMY N/A 11/26/2013   Procedure: LAPAROSCOPIC CHOLECYSTECTOMY;  Surgeon: Scherry Ran, MD;  Location: [facility];  Service: General;  Laterality: N/A;  . WISDOM TOOTH EXTRACTION     Family History:  Family History  Problem Relation Age of Onset  . Heart disease Father   . Hypertension Father   . Cancer Father   . Cirrhosis Father   . Thyroid disease Maternal Grandmother   . Cancer Maternal Grandmother   . Diabetes Maternal Grandfather   . Hypertension Maternal Grandfather   . Cancer  Maternal Aunt   . Thyroid disease Maternal Aunt   . Cancer Paternal Aunt   . Migraines Neg Hx    Social History:  History  Alcohol Use No    Comment: 08-02-2016 per pt do no     History  Drug Use No    Comment: 08-02-2016 per pt no    Social History   Social  History  . Marital status: Married    Spouse name: [name]   . Number of children: 1  . Years of education: 47   Occupational History  . [place] History Main Topics  . Smoking status: Current Every Day Smoker    Packs/day: 0.25    Years: 19.00    Types: Cigarettes    Last attempt to quit: 10/08/2011  . Smokeless tobacco: Never Used  . Alcohol use No     Comment: 08-02-2016 per pt do no  . Drug use: No     Comment: 08-02-2016 per pt no  . Sexual activity: Yes   Other Topics Concern  . None   Social History Narrative   Patient is a non smoker.   Patient works full-time.   Patient is married.    Patient has a high school education.    Patient works at [employer]    Caffeine: 60+ oz a day    Additional Social History:    Pain Medications: pt denies any alcohol or drug use Prescriptions: pt denies History of alcohol / drug use?: No history of alcohol / drug abuse  Sleep: Good  Appetite:  Good  Current Medications: Current Facility-Administered Medications  Medication Dose Route Frequency Provider Last Rate Last Dose  . acetaminophen (TYLENOL) tablet 650 mg  650 mg Oral Q6H PRN Ethelene Hal, NP   650 mg at 08/11/16 0809  . alum & mag hydroxide-simeth (MAALOX/MYLANTA) 200-200-20 MG/5ML suspension 30 mL  30 mL Oral Q4H PRN Ethelene Hal, NP      . buPROPion (WELLBUTRIN XL) 24 hr tablet 150 mg  150 mg Oral Daily Jenne Campus, MD   150 mg at 08/13/16 0759  . DULoxetine (CYMBALTA) DR capsule 60 mg  60 mg Oral Daily Ethelene Hal, NP   60 mg at 08/13/16 0758  . hydrOXYzine (ATARAX/VISTARIL) tablet 25 mg  25 mg Oral Q6H PRN Ethelene Hal, NP   25 mg at 08/12/16 2238  . levothyroxine (SYNTHROID, LEVOTHROID) tablet 150 mcg  150 mcg Oral QAC breakfast Ethelene Hal, NP   150 mcg at 08/13/16 2482  . magnesium hydroxide (MILK OF MAGNESIA) suspension 30 mL  30 mL Oral Daily PRN Ethelene Hal, NP      . nitrofurantoin  (macrocrystal-monohydrate) (MACROBID) capsule 100 mg  100 mg Oral Q12H Derrill Center, NP   100 mg at 08/13/16 0758  . traZODone (DESYREL) tablet 50 mg  50 mg Oral QHS PRN Ethelene Hal, NP        Lab Results:  No results found for this or any previous visit (from the past 48 hour(s)).  Blood Alcohol level:  No results found for: [facility]  Metabolic Disorder Labs: Lab Results  Component Value Date   HGBA1C 5.4 08/10/2016   MPG 108 08/10/2016   No results found for: PROLACTIN No results found for: CHOL, TRIG, HDL, CHOLHDL, VLDL, LDLCALC  Physical Findings: AIMS:  , ,  ,  ,    CIWA:    COWS:     Musculoskeletal:  Strength & Muscle Tone: within normal limits Gait & Station: normal Patient leans: N/A  Psychiatric Specialty Exam: Physical Exam  ROS no nausea, no vomiting   Blood pressure 127/86, pulse 91, temperature 97.9 F (36.6 C), temperature source Oral, resp. rate 16, height _0  (1.753 m), weight 117.5 kg (259 lb), SpO2 100 %.Body mass index is 38.25 kg/m.  General Appearance: Well Groomed  Eye Contact:  Good  Speech:  Normal Rate  Volume:  Normal  Mood:  improved   Affect:  reactive, vaguely anxious   Thought Process:  Goal Directed and Descriptions of Associations: Intact  Orientation:  Full (Time, Place, and Person)  Thought Content:  no hallucinations, no delusions, not internally preoccupied  Suicidal Thoughts:  No- denies suicidal or self injurious ideations,l no homicidal or violent ideations   Homicidal Thoughts:  No  Memory:  recent and remote grossly intact   Judgement:  Good  Insight:  Good  Psychomotor Activity:  Normal  Concentration:  Concentration: Good and Attention Span: Good  Recall:  Good  Fund of Knowledge:  Good  Language:  Good  Akathisia:  Negative  Handed:  Right  AIMS (if indicated):     Assets:  Desire for Improvement Physical Health  ADL's:  Intact  Cognition:  WNL  Sleep:  Number of Hours: 5.75   Assessment - patient  continues to report improved mood and affect is reactive, albeit somewhat anxious. No SI. Focuses on marital difficulties, stressors, and states that she feels marital issues have contributed to depression and anxiety. She is interested in having a family meeting with her husband prior to discharging , feels this would likely help her feel better . Denies medication side effects.     Treatment Plan Summary: Treatment plan reviewed as below today 08/13/16. Daily contact with patient to assess and evaluate symptoms and progress in treatment, Medication management, Plan inpatient treatment  and medications as below  Encourage group and milieu participation to work on coping skills and symptom reduction  Continue Wellbutrin XL 150 mgrs QDAY for depression Continue Cymbalta 60 mgrs QDAY for depression and anxiety Continue Synthroid for history of hypothyroidism  Continue Trazodone 50 mgrs QHS PRN for insomnia Continue Vistaril 25 mgrs Q 6 hours PRN for anxiety Family meeting scheduled for tomorrow afternoon.  Treatment team working on disposition planning options   Jenne Campus, MD 08/13/2016, 1:34 PM   Patient ID: Catherine [name], female   DOB: 1975-10-13, 41 y.o.   MRN: [id number]

## 2016-08-13 NOTE — BHH Suicide Risk Assessment (Signed)
[facility] INPATIENT:  Family/Significant Other Suicide Prevention Education  Suicide Prevention Education:  Education Completed; Loura Back, Pt's sister [phone], has been identified by the patient as the family member/significant other with whom the patient will be residing, and identified as the person(s) who will aid the patient in the event of a mental health crisis (suicidal ideations/suicide attempt).  With written consent from the patient, the family member/significant other has been provided the following suicide prevention education, prior to the and/or following the discharge of the patient.  The suicide prevention education provided includes the following:  Suicide risk factors  Suicide prevention and interventions  National Suicide Hotline telephone number  [facility] assessment telephone number  [facility] Emergency Assistance [place] and/or Residential Mobile Crisis Unit telephone number  Request made of family/significant other to:  Remove weapons (e.g., guns, rifles, knives), all items previously/currently identified as safety concern.    Remove drugs/medications (over-the-counter, prescriptions, illicit drugs), all items previously/currently identified as a safety concern.  The family member/significant other verbalizes understanding of the suicide prevention education information provided.  The family member/significant other agrees to remove the items of safety concern listed above.  Pt's sister does not feel that Pt is at risk for suicide and feels that she would safe to return home. She does feel that Pt is struggling with depression but expresses support in that Pt can come live with her.   Glad[place] Lighter 08/13/2016, 3:22 PM

## 2016-08-13 NOTE — Progress Notes (Signed)
Adult Psychoeducational Group Note  Date:  08/13/2016 Time:  9:12 PM  Group Topic/Focus:  Wrap-Up Group:   The focus of this group is to help patients review their daily goal of treatment and discuss progress on daily workbooks.  Participation Level:  Active  Participation Quality:  Appropriate  Affect:  Appropriate  Cognitive:  Alert  Insight: Appropriate  Engagement in Group:  Engaged  Modes of Intervention:  Discussion  Additional Comments:  Pt rated her day 6/10. Her goal is to prepare for discharge.   Wynelle Fanny R 08/13/2016, 9:12 PM

## 2016-08-14 MED ORDER — BUPROPION HCL ER (XL) 150 MG PO TB24: 150.0000 mg | ORAL_TABLET | Freq: Every day | ORAL | 0 refills | Status: DC

## 2016-08-14 MED ORDER — DULOXETINE HCL 60 MG PO CPEP: 60.0000 mg | ORAL_CAPSULE | Freq: Every day | ORAL | 0 refills | Status: DC

## 2016-08-14 MED ORDER — LEVOTHYROXINE SODIUM 150 MCG PO TABS: 150.0000 ug | ORAL_TABLET | Freq: Every day | ORAL | 0 refills | Status: DC

## 2016-08-14 MED ORDER — TRAZODONE HCL 50 MG PO TABS: 50.0000 mg | ORAL_TABLET | Freq: Every evening | ORAL | 0 refills | Status: DC | PRN

## 2016-08-14 MED ORDER — HYDROXYZINE HCL 25 MG PO TABS: 25.0000 mg | ORAL_TABLET | Freq: Four times a day (QID) | ORAL | 0 refills | Status: DC | PRN

## 2016-08-14 NOTE — Progress Notes (Signed)
[facility] MD Progress Note  08/14/2016 4:15 PM Catherine [name]  MRN:  [id number] Subjective: overall patient feels she has improved compared to admission. She reports medications are well tolerated, and " helping ". She continues to describe depression, anxiety, and stress concerning her marriage, stating she feels she needs time to herself , but also feels guilty about how this is affecting her husband and child . Denies any suicidal ideations, and identifies love for her son as a protective factor against hurting herself.   Objective : I have discussed case with treatment team and have met with patient . Patient presenting with partial improvement of mood and affect compared to admission, but remains labile and tearful at times, particularly when reviewing marital stress. At her request we had a family meeting with her husband, her, CSW. Patient's husband stated that they had a stable relationship until late last year, and feels that marital conflict has largely been a factor of her mood disorder/depression. Stated he is supportive of her and would want to go to couples therapy . She stated that she felt she needed some time to focus on herself , during which she intends to live with a sister, and felt that at this time she needed to work on getting better, rather than focusing on relationship /marriage. She denied any suicidal ideations and reiterated that she wanted to " live so I can be a part of my son's life ". ( Husband has custody of son at this time ) . Patient emotional, intermittently tearful during session. Patient's mood improved partially when husband reassured her that he would take son to sister's to visit with patient after her discharge. Reviewed history - patient and husband agree on history of worsening depression over recent weeks to months, do not endorse any clear history of bipolarity or mania.  We also discussed disposition planning- patient expressing interest in stepping down to a  more intensive option such as IOP in order to continue outpatient management after discharge. No disruptive or agitated behaviors on unit at this time    Principal Problem: Major depressive disorder, recurrent episode, moderate ([place]) Diagnosis:   Patient Active Problem List   Diagnosis Date Noted  . MDD (major depressive disorder), recurrent severe, without psychosis ([place]) [F33.2] 08/10/2016  . Major depressive disorder, recurrent episode, moderate ([place]) [F33.1] 08/02/2016  . Migraine with status migrainosus [G43.901] 02/24/2014  . Cholelithiasis with acute cholecystitis [K80.00] 11/26/2013  . GERD (gastroesophageal reflux disease) [K21.9] 11/10/2013  . Vaginitis and vulvovaginitis, unspecified [N76.0] 11/10/2013  . S/P hysterectomy [Z90.710] 10/06/2013  . Dyspareunia [IMO0002] 09/21/2013  . Excessive or frequent menstruation [N92.0] 09/21/2013   Total Time spent with patient: 40 minutes  more than 50 % of time spent on counseling and disposition planning   Past Medical History:  Past Medical History:  Diagnosis Date  . Allergic rhinitis   . Anxiety   . Deviated septum   . FHx: allergies   . GERD (gastroesophageal reflux disease)   . H/O scarlet fever   . Hiatal hernia   . History of kidney stones   . Hypertension   . Hypothyroidism   . Kidney stones     Past Surgical History:  Procedure Laterality Date  . ABDOMINAL HYSTERECTOMY N/A 10/06/2013   Procedure: HYSTERECTOMY ABDOMINAL;  Surgeon: Florian Buff, MD;  Location: [facility];  Service: Gynecology;  Laterality: N/A;  . BILATERAL SALPINGECTOMY Bilateral 10/06/2013   Procedure: BILATERAL SALPINGECTOMY;  Surgeon: Florian Buff, MD;  Location: [facility];  Service: Gynecology;  Laterality: Bilateral;  . BLADDER SURGERY     reports had bladder stretched as child  . CHOLECYSTECTOMY N/A 11/26/2013   Procedure: LAPAROSCOPIC CHOLECYSTECTOMY;  Surgeon: Scherry Ran, MD;  Location: [facility];  Service: General;  Laterality: N/A;  .  WISDOM TOOTH EXTRACTION     Family History:  Family History  Problem Relation Age of Onset  . Heart disease Father   . Hypertension Father   . Cancer Father   . Cirrhosis Father   . Thyroid disease Maternal Grandmother   . Cancer Maternal Grandmother   . Diabetes Maternal Grandfather   . Hypertension Maternal Grandfather   . Cancer Maternal Aunt   . Thyroid disease Maternal Aunt   . Cancer Paternal Aunt   . Migraines Neg Hx    Social History:  History  Alcohol Use No    Comment: 08-02-2016 per pt do no     History  Drug Use No    Comment: 08-02-2016 per pt no    Social History   Social History  . Marital status: Married    Spouse name: [name]   . Number of children: 1  . Years of education: 20   Occupational History  . [place] History Main Topics  . Smoking status: Current Every Day Smoker    Packs/day: 0.25    Years: 19.00    Types: Cigarettes    Last attempt to quit: 10/08/2011  . Smokeless tobacco: Never Used  . Alcohol use No     Comment: 08-02-2016 per pt do no  . Drug use: No     Comment: 08-02-2016 per pt no  . Sexual activity: Yes   Other Topics Concern  . None   Social History Narrative   Patient is a non smoker.   Patient works full-time.   Patient is married.    Patient has a high school education.    Patient works at [employer]    Caffeine: 60+ oz a day    Additional Social History:    Pain Medications: pt denies any alcohol or drug use Prescriptions: pt denies History of alcohol / drug use?: No history of alcohol / drug abuse  Sleep: Good  Appetite:  Good  Current Medications: Current Facility-Administered Medications  Medication Dose Route Frequency Provider Last Rate Last Dose  . acetaminophen (TYLENOL) tablet 650 mg  650 mg Oral Q6H PRN Ethelene Hal, NP   650 mg at 08/14/16 0630  . alum & mag hydroxide-simeth (MAALOX/MYLANTA) 200-200-20 MG/5ML suspension 30 mL  30 mL Oral Q4H PRN Ethelene Hal, NP       . buPROPion (WELLBUTRIN XL) 24 hr tablet 150 mg  150 mg Oral Daily Jenne Campus, MD   150 mg at 08/14/16 1001  . DULoxetine (CYMBALTA) DR capsule 60 mg  60 mg Oral Daily Ethelene Hal, NP   60 mg at 08/14/16 1001  . hydrOXYzine (ATARAX/VISTARIL) tablet 25 mg  25 mg Oral Q6H PRN Ethelene Hal, NP   25 mg at 08/13/16 2007  . levothyroxine (SYNTHROID, LEVOTHROID) tablet 150 mcg  150 mcg Oral QAC breakfast Ethelene Hal, NP   150 mcg at 08/14/16 2641  . magnesium hydroxide (MILK OF MAGNESIA) suspension 30 mL  30 mL Oral Daily PRN Ethelene Hal, NP      . nitrofurantoin (macrocrystal-monohydrate) (MACROBID) capsule 100 mg  100 mg Oral Q12H Derrill Center, NP   100  mg at 08/14/16 1001  . traZODone (DESYREL) tablet 50 mg  50 mg Oral QHS PRN Ethelene Hal, NP        Lab Results:  No results found for this or any previous visit (from the past 48 hour(s)).  Blood Alcohol level:  No results found for: [facility]  Metabolic Disorder Labs: Lab Results  Component Value Date   HGBA1C 5.4 08/10/2016   MPG 108 08/10/2016   No results found for: PROLACTIN No results found for: CHOL, TRIG, HDL, CHOLHDL, VLDL, LDLCALC  Physical Findings: AIMS:  , ,  ,  ,    CIWA:    COWS:     Musculoskeletal: Strength & Muscle Tone: within normal limits Gait & Station: normal Patient leans: N/A  Psychiatric Specialty Exam: Physical Exam  ROS no headache , no nausea, no vomiting   Blood pressure 114/79, pulse 93, temperature 97.3 F (36.3 C), temperature source Oral, resp. rate 16, height _0  (1.753 m), weight 117.5 kg (259 lb), SpO2 100 %.Body mass index is 38.25 kg/m.  General Appearance: improved grooming   Eye Contact:  Good  Speech:  Normal Rate  Volume:  Normal  Mood:  reports mood has imrpoved compared to admission, but still feels depressed   Affect:  reactive, tearful at times   Thought Process:  Linear and Descriptions of Associations: Intact  Orientation:   Other:  fully alert and attentive   Thought Content:  No hallucinations, no delusions   Suicidal Thoughts:  No-denies suicidal or homicidal ideations, establishes her commitment to son as a protective factor against suicide   Homicidal Thoughts:  No- denies any violent or homicidal ideations  Memory:  recent and remote grossly intact   Judgement:  Other:  improving   Insight:  improving   Psychomotor Activity:  Normal  Concentration:  Concentration: Good and Attention Span: Good  Recall:  Good  Fund of Knowledge:  Good  Language:  Good  Akathisia:  Negative  Handed:  Right  AIMS (if indicated):     Assets:  Communication Skills Desire for Improvement Resilience  ADL's:  Intact  Cognition:  WNL  Sleep:  Number of Hours: 6.25   Assessment - patient reports partial improvement compared to admission , overall feeling better, less depressed , denies any SI. She does however endorse ongoing symptoms of depression, anxiety, and presents with emotional lability. At her request we had family meeting with her husband and her- husband reports patient has been significantly depressed over recent weeks to months and this has negatively affected marriage. Patient states she  is currently wanting to separate from husband for a period of time, live with her sister, and focus on her own improvement, stability.  * Patient stated she preferred to discharge with/ to sister's., and expressed interest in IOP referral  Tolerating medications well .      Treatment Plan Summary: Treatment plan reviewed as below today 08/14/16. Daily contact with patient to assess and evaluate symptoms and progress in treatment, Medication management, Plan inpatient treatment  and medications as below  Encourage group and milieu participation to work on coping skills and symptom reduction  Continue Wellbutrin XL 150 mgrs QDAY for depression Continue Cymbalta 60 mgrs QDAY for depression and anxiety Continue Synthroid for  history of hypothyroidism  Continue Trazodone 50 mgrs QHS PRN for insomnia Continue Vistaril 25 mgrs Q 6 hours PRN for anxiety Treatment team working on disposition planning options  Discharge was tentatively scheduled for today- we discussed-  at this time will cancel discharge/work on disposition planning ( see above). RN /CSW  Aware .   Jenne Campus, MD 08/14/2016, 4:15 PM   Patient ID: Catherine [name], female   DOB: [birth year], 41 y.o.   MRN: [id number]

## 2016-08-14 NOTE — Tx Team (Signed)
Interdisciplinary Treatment and Diagnostic Plan Update  08/14/2016 Time of Session: 9:30am Catherine [name] MRN: [id number]  Principal Diagnosis: Major depressive disorder, recurrent episode, moderate ([place])  Secondary Diagnoses: Principal Problem:   Major depressive disorder, recurrent episode, moderate ([place]) Active Problems:   MDD (major depressive disorder), recurrent severe, without psychosis ([place])   Current Medications:  Current Facility-Administered Medications  Medication Dose Route Frequency Provider Last Rate Last Dose  . acetaminophen (TYLENOL) tablet 650 mg  650 mg Oral Q6H PRN Ethelene Hal, NP   650 mg at 08/14/16 0630  . alum & mag hydroxide-simeth (MAALOX/MYLANTA) 200-200-20 MG/5ML suspension 30 mL  30 mL Oral Q4H PRN Ethelene Hal, NP      . buPROPion (WELLBUTRIN XL) 24 hr tablet 150 mg  150 mg Oral Daily Jenne Campus, MD   150 mg at 08/14/16 1001  . DULoxetine (CYMBALTA) DR capsule 60 mg  60 mg Oral Daily Ethelene Hal, NP   60 mg at 08/14/16 1001  . hydrOXYzine (ATARAX/VISTARIL) tablet 25 mg  25 mg Oral Q6H PRN Ethelene Hal, NP   25 mg at 08/13/16 2007  . levothyroxine (SYNTHROID, LEVOTHROID) tablet 150 mcg  150 mcg Oral QAC breakfast Ethelene Hal, NP   150 mcg at 08/14/16 9147  . magnesium hydroxide (MILK OF MAGNESIA) suspension 30 mL  30 mL Oral Daily PRN Ethelene Hal, NP      . nitrofurantoin (macrocrystal-monohydrate) (MACROBID) capsule 100 mg  100 mg Oral Q12H Derrill Center, NP   100 mg at 08/14/16 1001  . traZODone (DESYREL) tablet 50 mg  50 mg Oral QHS PRN Ethelene Hal, NP        PTA Medications: Facility-Administered Medications Prior to Admission  Medication Dose Route Frequency Provider Last Rate Last Dose  . [DISCONTINUED] methylPREDNISolone sodium succinate (SOLU-MEDROL) 250 mg in sodium chloride 0.9 % 100 mL IVPB  250 mg Intravenous Continuous [name], MD   250 mg at 03/08/14 1440  .  [DISCONTINUED] valproate (DEPACON) 1,000 mg in sodium chloride 0.9 % 100 mL IVPB  1,000 mg Intravenous Continuous [name], MD   Stopped at 03/08/14 1517  . [DISCONTINUED] valproate (DEPACON) 500 mg in sodium chloride 0.9 % 100 mL IVPB  500 mg Intravenous Continuous [name], MD 105 mL/hr at 03/08/14 1426 500 mg at 03/08/14 1426   Prescriptions Prior to Admission  Medication Sig Dispense Refill Last Dose  . buPROPion (WELLBUTRIN XL) 150 MG 24 hr tablet Take 1 tablet (150 mg total) by mouth daily. 30 tablet 1 08/10/2016 at Unknown time  . DULoxetine (CYMBALTA) 60 MG capsule Take 1 capsule (60 mg total) by mouth daily. 30 capsule 1 08/10/2016 at Unknown time  . levothyroxine (SYNTHROID, LEVOTHROID) 150 MCG tablet Take 150 mcg by mouth daily before breakfast.    Past Week at Unknown time  . LORazepam (ATIVAN) 0.5 MG tablet Take 1 tablet (0.5 mg total) by mouth daily as needed for anxiety. 30 tablet 0 Past Month at Unknown time    Treatment Modalities: Medication Management, Group therapy, Case management,  1 to 1 session with clinician, Psychoeducation, Recreational therapy.  Patient Stressors: Financial difficulties Marital or family conflict  Patient Strengths: Ability for insight Active sense of humor Average or above average intelligence Capable of independent living Occupational psychologist fund of knowledge Motivation for treatment/growth Physical Health Supportive family/friends  Physician Treatment Plan for Primary Diagnosis: Major depressive disorder, recurrent episode, moderate ([place])  Long Term Goal(s): Improvement in symptoms so as ready for discharge  Short Term Goals: Ability to identify changes in lifestyle to reduce recurrence of condition will improve Ability to verbalize feelings will improve Ability to identify and develop effective coping behaviors will improve Ability to identify triggers associated with substance abuse/mental health  issues will improve Ability to identify changes in lifestyle to reduce recurrence of condition will improve Ability to verbalize feelings will improve Ability to demonstrate self-control will improve  Medication Management: Evaluate patient's response, side effects, and tolerance of medication regimen.  Therapeutic Interventions: 1 to 1 sessions, Unit Group sessions and Medication administration.  Evaluation of Outcomes: Adequate for Discharge  Physician Treatment Plan for Secondary Diagnosis: Principal Problem:   Major depressive disorder, recurrent episode, moderate (H[facility]) Active Problems:   MDD (major depressive disorder), recurrent severe, without psychosis ([place])   Long Term Goal(s): Improvement in symptoms so as ready for discharge  Short Term Goals: Ability to identify changes in lifestyle to reduce recurrence of condition will improve Ability to verbalize feelings will improve Ability to identify and develop effective coping behaviors will improve Ability to identify triggers associated with substance abuse/mental health issues will improve Ability to identify changes in lifestyle to reduce recurrence of condition will improve Ability to verbalize feelings will improve Ability to demonstrate self-control will improve  Medication Management: Evaluate patient's response, side effects, and tolerance of medication regimen.  Therapeutic Interventions: 1 to 1 sessions, Unit Group sessions and Medication administration.  Evaluation of Outcomes: Adequate for Discharge   RN Treatment Plan for Primary Diagnosis: Major depressive disorder, recurrent episode, moderate (HCC) Long Term Goal(s): Knowledge of disease and therapeutic regimen to maintain health will improve  Short Term Goals: Ability to verbalize feelings will improve, Ability to disclose and discuss suicidal ideas and Ability to identify and develop effective coping behaviors will improve  Medication Management: RN will  administer medications as ordered by provider, will assess and evaluate patient's response and provide education to patient for prescribed medication. RN will report any adverse and/or side effects to prescribing provider.  Therapeutic Interventions: 1 on 1 counseling sessions, Psychoeducation, Medication administration, Evaluate responses to treatment, Monitor vital signs and CBGs as ordered, Perform/monitor CIWA, COWS, AIMS and Fall Risk screenings as ordered, Perform wound care treatments as ordered.  Evaluation of Outcomes: Met   LCSW Treatment Plan for Primary Diagnosis: Major depressive disorder, recurrent episode, moderate (HCC) Long Term Goal(s): Safe transition to appropriate next level of care at discharge, Engage patient in therapeutic group addressing interpersonal concerns.  Short Term Goals: Engage patient in aftercare planning with referrals and resources, Identify triggers associated with mental health/substance abuse issues and Increase skills for wellness and recovery  Therapeutic Interventions: Assess for all discharge needs, 1 to 1 time with Social worker, Explore available resources and support systems, Assess for adequacy in community support network, Educate family and significant other(s) on suicide prevention, Complete Psychosocial Assessment, Interpersonal group therapy.  Evaluation of Outcomes: Adequate for Discharge   Progress in Treatment: Attending groups: Yes Participating in groups: Yes Taking medication as prescribed: Yes, MD continues to assess for medication changes as needed Toleration medication: Yes, no side effects reported at this time Family/Significant other contact made: Yes with sister Patient understands diagnosis: Continuing to assess Discussing patient identified problems/goals with staff: Yes Medical problems stabilized or resolved: Yes Denies suicidal/homicidal ideation: Yes Issues/concerns per patient self-inventory: None Other: N/A  New  problem(s) identified: None identified at this time.   New Short Term/Long  Term Goal(s): None identified at this time.   Discharge Plan or Barriers: Pt will return home and follow-up with outpatient services at [facility]  Reason for Continuation of Hospitalization: None identified at this time.   Estimated Length of Stay: 0 days  Attendees: Patient: 08/14/2016  1:14 PM  Physician: Dr. Parke Poisson 08/14/2016  1:14 PM  Nursing: Loletta Specter, RN; Gaylan Gerold, RN 08/14/2016  1:14 PM  RN Care Manager: Lars Pinks, RN 08/14/2016  1:14 PM  Social Worker: Adriana Reams, LCSW; Matthew Saras, [place] 08/14/2016  1:14 PM  Recreational Therapist:  08/14/2016  1:14 PM  Other: Lindell Spar, NP; Samuel Jester, NP 08/14/2016  1:14 PM  Other:  08/14/2016  1:14 PM  Other: 08/14/2016  1:14 PM    Scribe for Treatment Team: Gladstone Lighter, LCSW 08/14/2016 1:14 PM
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
## 2016-08-14 NOTE — Plan of Care (Signed)
Problem: Medication: Goal: Compliance with prescribed medication regimen will improve Outcome: Progressing Pt has been compliant with medication regimen tonight.

## 2016-08-14 NOTE — Progress Notes (Signed)
Patient rescinded 72 hour RFD which is filed in paper chart.

## 2016-08-14 NOTE — [facility] Group Notes (Signed)
[facility] LCSW Group Therapy  08/14/2016 1:15 PM  Type of Therapy:  Group Therapy  Participation Level:  Active  Participation Quality:  Appropriate and Attentive  Affect:  Appropriate but quiet and reserved  Cognitive:  Appropriate  Insight:  Developing/Improving  Engagement in Therapy:  Developing/Improving  Modes of Intervention:  Discussion, Exploration, Problem-solving and Support  Summary of Progress/Problems:  Emotion Regulation: This group focused on both positive and negative emotion identification and allowed group members to process ways to identify feelings, regulate negative emotions, and find healthy ways to manage internal/external emotions. Group members were asked to reflect on a time when their reaction to an emotion led to a negative outcome and explored how alternative responses using emotion regulation would have benefited them. Group members were also asked to discuss a time when emotion regulation was utilized when a negative emotion was experienced.   Patient described an "overwhelming sense of guilt", "takes over", feels guilty about choices she has made.  Also described shame over having caused worry to her family and not discussing her depression with them.  States she puts herself last and does not practice good self care, states she wants to change that upon discharge.  Catherine [name] 08/14/2016, 2:37 PM

## 2016-08-14 NOTE — Progress Notes (Signed)
  [facility] Adult Case Management Discharge Plan :  Will you be returning to the same living situation after discharge:  No. Pt plans to discharge to sister's house  At discharge, do you have transportation home?: Yes,  Pt sister to pick up Do you have the ability to pay for your medications: Yes,  Pt provided with prescriptions  Release of information consent forms completed and in the chart;  Patient's signature needed at discharge.  Patient to Follow up at: Follow-up Information    [facility] Follow up on 08/30/2016.   Specialty:  Behavioral Health Why:  at 9:15am with Dr. Modesta Messing for medication management. Contact information: [address] [place] [facility] Follow up on 08/29/2016.   Specialty:  Behavioral Health Why:  at 8am for therapy with [facility]. This was your therapist's first available appointment. You may call to see if there have been any cancellations before your scheduled appointment.  Contact information: [place] [phone]          Next level of care provider has access to [place] and Suicide Prevention discussed: Yes,  with sister; see SPE note  Have you used any form of tobacco in the last 30 days? (Cigarettes, Smokeless Tobacco, Cigars, and/or Pipes): Yes  Has patient been referred to the Quitline?: Patient refused referral  Patient has been referred for addiction treatment: Yes  Gladstone Lighter 08/14/2016, 1:16 PM

## 2016-08-14 NOTE — Discharge Summary (Deleted)
Physician Discharge Summary Note  Patient:  Catherine [name] is an 41 y.o., female MRN:  [id number] DOB:  Sep 25, 1975 Patient phone:  [phone] (home)  Patient address:   [place] [address],  Total Time spent with patient: 30 minutes  Date of Admission:  08/10/2016 Date of Discharge: 08/14/2016  Reason for Admission:  Depressed state  Principal Problem: Major depressive disorder, recurrent episode, moderate ([place]) Discharge Diagnoses: Patient Active Problem List   Diagnosis Date Noted  . MDD (major depressive disorder), recurrent severe, without psychosis ([place]) [F33.2] 08/10/2016  . Major depressive disorder, recurrent episode, moderate ([place]) [F33.1] 08/02/2016  . Migraine with status migrainosus [G43.901] 02/24/2014  . Cholelithiasis with acute cholecystitis [K80.00] 11/26/2013  . GERD (gastroesophageal reflux disease) [K21.9] 11/10/2013  . Vaginitis and vulvovaginitis, unspecified [N76.0] 11/10/2013  . S/P hysterectomy [Z90.710] 10/06/2013  . Dyspareunia [IMO0002] 09/21/2013  . Excessive or frequent menstruation [N92.0] 09/21/2013    Past Psychiatric History: see HPI  Past Medical History:  Past Medical History:  Diagnosis Date  . Allergic rhinitis   . Anxiety   . Deviated septum   . FHx: allergies   . GERD (gastroesophageal reflux disease)   . H/O scarlet fever   . Hiatal hernia   . History of kidney stones   . Hypertension   . Hypothyroidism   . Kidney stones     Past Surgical History:  Procedure Laterality Date  . ABDOMINAL HYSTERECTOMY N/A 10/06/2013   Procedure: HYSTERECTOMY ABDOMINAL;  Surgeon: Florian Buff, MD;  Location: [facility];  Service: Gynecology;  Laterality: N/A;  . BILATERAL SALPINGECTOMY Bilateral 10/06/2013   Procedure: BILATERAL SALPINGECTOMY;  Surgeon: Florian Buff, MD;  Location: [facility];  Service: Gynecology;  Laterality: Bilateral;  . BLADDER SURGERY     reports had bladder stretched as child  . CHOLECYSTECTOMY N/A 11/26/2013    Procedure: LAPAROSCOPIC CHOLECYSTECTOMY;  Surgeon: Scherry Ran, MD;  Location: [facility];  Service: General;  Laterality: N/A;  . WISDOM TOOTH EXTRACTION     Family History:  Family History  Problem Relation Age of Onset  . Heart disease Father   . Hypertension Father   . Cancer Father   . Cirrhosis Father   . Thyroid disease Maternal Grandmother   . Cancer Maternal Grandmother   . Diabetes Maternal Grandfather   . Hypertension Maternal Grandfather   . Cancer Maternal Aunt   . Thyroid disease Maternal Aunt   . Cancer Paternal Aunt   . Migraines Neg Hx    Family Psychiatric  History:  See HPI Social History:  History  Alcohol Use No    Comment: 08-02-2016 per pt do no     History  Drug Use No    Comment: 08-02-2016 per pt no    Social History   Social History  . Marital status: Married    Spouse name: [name]   . Number of children: 1  . Years of education: 47   Occupational History  . [place] History Main Topics  . Smoking status: Current Every Day Smoker    Packs/day: 0.25    Years: 19.00    Types: Cigarettes    Last attempt to quit: 10/08/2011  . Smokeless tobacco: Never Used  . Alcohol use No     Comment: 08-02-2016 per pt do no  . Drug use: No     Comment: 08-02-2016 per pt no  . Sexual activity: Yes   Other Topics Concern  .  None   Social History Narrative   Patient is a non smoker.   Patient works full-time.   Patient is married.    Patient has a high school education.    Patient works at [employer]    Caffeine: 60+ oz a day     Hospital Course:  Catherine [name], 41 yo, admitted after she was initially seen at [facility] walk in.  She states that she has been in a depressed state.  Patient cited several significant stressors such as marital separation, bankruptcy and little financial support as she tries to raise her children.  Catherine [name] was admitted for Major depressive disorder, recurrent episode, moderate ([place]) and crisis  management.  Patient was treated with medications with their indications listed below in detail under Medication List.  Medical problems were identified and treated as needed.  Home medications were restarted as appropriate.  Improvement was monitored by observation and Catherine [name] daily report of symptom reduction.  Emotional and mental status was monitored by daily self inventory reports completed by Catherine [name] and clinical staff.  Patient reported continued improvement, denied any new concerns.  Patient had been compliant on medications and denied side effects.  Support and encouragement was provided.         Catherine [name] was evaluated by the treatment team for stability and plans for continued recovery upon discharge.  Patient was offered further treatment options upon discharge including Residential, Intensive Outpatient and Outpatient treatment. Patient will follow up with agency listed below for medication management and counseling.  Encouraged patient to maintain satisfactory support network and home environment.  Advised to adhere to medication compliance and outpatient treatment follow up.  Prescriptions provided.       Catherine [name] motivation was an integral factor for scheduling further treatment.  Employment, transportation, bed availability, health status, family support, and any pending legal issues were also considered during patient's hospital stay.  Upon completion of this admission the patient was both mentally and medically stable for discharge denying suicidal/homicidal ideation, auditory/visual/tactile hallucinations, delusional thoughts and paranoia.      Physical Findings: AIMS:  , ,  ,  ,    CIWA:    COWS:     Musculoskeletal: Strength & Muscle Tone: within normal limits Gait & Station: normal Patient leans: N/A  Psychiatric Specialty Exam:  SEE MD SRA Physical Exam  ROS  Blood pressure 114/79, pulse 93, temperature 97.3 F (36.3 C), temperature source Oral,  resp. rate 16, height 5\' 9"  (1.753 m), weight 117.5 kg (259 lb), SpO2 100 %.Body mass index is 38.25 kg/m.    Have you used any form of tobacco in the last 30 days? (Cigarettes, Smokeless Tobacco, Cigars, and/or Pipes): Yes  Has this patient used any form of tobacco in the last 30 days? (Cigarettes, Smokeless Tobacco, Cigars, and/or Pipes) Yes, N/A  Blood Alcohol level:  No results found for: [facility]  Metabolic Disorder Labs:  Lab Results  Component Value Date   HGBA1C 5.4 08/10/2016   MPG 108 08/10/2016   No results found for: PROLACTIN No results found for: CHOL, TRIG, HDL, CHOLHDL, VLDL, LDLCALC  See Psychiatric Specialty Exam and Suicide Risk Assessment completed by Attending Physician prior to discharge.  Discharge destination:  Home  Is patient on multiple antipsychotic therapies at discharge:  No   Has Patient had three or more failed trials of antipsychotic monotherapy by history:  No  Recommended Plan for Multiple Antipsychotic Therapies: NA   Allergies as  of 08/14/2016      Reactions   Morphine And Related Other (See Comments)   headache   Sulfa Antibiotics Other (See Comments)   Gives pt Yeast infection      Medication List    STOP taking these medications   LORazepam 0.5 MG tablet Commonly known as:  ATIVAN     TAKE these medications     Indication  buPROPion 150 MG 24 hr tablet Commonly known as:  WELLBUTRIN XL Take 1 tablet (150 mg total) by mouth daily. Start taking on:  08/15/2016  Indication:  Major Depressive Disorder   DULoxetine 60 MG capsule Commonly known as:  CYMBALTA Take 1 capsule (60 mg total) by mouth daily. Start taking on:  08/15/2016  Indication:  Major Depressive Disorder   hydrOXYzine 25 MG tablet Commonly known as:  ATARAX/VISTARIL Take 1 tablet (25 mg total) by mouth every 6 (six) hours as needed for anxiety.  Indication:  Anxiety Neurosis   levothyroxine 150 MCG tablet Commonly known as:  SYNTHROID, LEVOTHROID Take 1 tablet  (150 mcg total) by mouth daily before breakfast. Start taking on:  08/15/2016  Indication:  Underactive Thyroid   traZODone 50 MG tablet Commonly known as:  DESYREL Take 1 tablet (50 mg total) by mouth at bedtime as needed for sleep.  Indication:  [facility] Follow up on 08/30/2016.   Specialty:  Behavioral Health Why:  at 9:15am with Dr. Modesta Messing for medication management. Contact information: [address] [place] [facility] Follow up on 08/29/2016.   Specialty:  Behavioral Health Why:  at 8am for therapy with [facility] This was your therapist's first available appointment. You may call to see if there have been any cancellations before your scheduled appointment.  Contact information: [place] [phone]          Follow-up recommendations:  Activity:  as tol Diet:  as tol  Comments:  1.  Take all your medications as prescribed.   2.  Report any adverse side effects to outpatient provider. 3.  Patient instructed to not use alcohol or illegal drugs while on prescription medicines. 4.  In the event of worsening symptoms, instructed patient to call 911, the crisis hotline or go to nearest emergency room for evaluation of symptoms.  Signed: Janett Labella, NP [facility] 08/14/2016, 12:52 PM

## 2016-08-14 NOTE — Progress Notes (Signed)
Recreation Therapy Notes  Date: 08/14/16 Time: 0930 Location: [address] Group Room  Group Topic: Stress Management  Goal Area(s) Addresses:  Patient will verbalize importance of using healthy stress management.  Patient will identify positive emotions associated with healthy stress management.   Behavioral Response: Engaged  Intervention: Stress Management  Activity :  Mindfulness Meditation.  LRT introduced the stress management technique of mindfulness meditation.  LRT played a meditation from the Calm app to allow patients to participate in mindfulness.  Patients were to follow along as the meditation played.   Education:  Stress Management, Discharge Planning.   Education Outcome: Acknowledges edcuation/In group clarification offered/Needs additional education  Clinical Observations/Feedback: Pt attended group.   Victorino Sparrow, LRT/CTRS         Victorino Sparrow A 08/14/2016 11:28 AM

## 2016-08-14 NOTE — Progress Notes (Signed)
D: Patient up and visible in the milieu. Spoke with patient 1:1. Rates sleep as fair, appetite as good, energy as low and concentration as good. Patient's affect animated, mood pleasant. Rating depression at a 5/10, hopelessness at a 3/10 and anxiety at a 1/10. States goal for today is to "go home to see my child. Meet with MD and Education officer, museum and husband." Denies pain, physical problems.   A: Medicated per orders, no prns required or requested. Emotional support offered and self inventory reviewed. Encouraged completion of Suicide Safety Plan. Discussed POC with MD, SW.   R: Patient verbalizes understanding of POC. Patient denies SI/HI and remains safe on level III obs. Awaiting discharge after family session.

## 2016-08-15 NOTE — BHH Suicide Risk Assessment (Signed)
[facility] Discharge Suicide Risk Assessment   Principal Problem: Major depressive disorder, recurrent episode, moderate ([place]) Discharge Diagnoses:  Patient Active Problem List   Diagnosis Date Noted  . MDD (major depressive disorder), recurrent severe, without psychosis ([place]) [F33.2] 08/10/2016  . Major depressive disorder, recurrent episode, moderate ([place]) [F33.1] 08/02/2016  . Migraine with status migrainosus [G43.901] 02/24/2014  . Cholelithiasis with acute cholecystitis [K80.00] 11/26/2013  . GERD (gastroesophageal reflux disease) [K21.9] 11/10/2013  . Vaginitis and vulvovaginitis, unspecified [N76.0] 11/10/2013  . S/P hysterectomy [Z90.710] 10/06/2013  . Dyspareunia [IMO0002] 09/21/2013  . Excessive or frequent menstruation [N92.0] 09/21/2013    Total Time spent with patient: 30 minutes  Musculoskeletal: Strength & Muscle Tone: within normal limits Gait & Station: normal Patient leans: N/A  Psychiatric Specialty Exam: ROS no headache, no chest pain, no shortness of breath, no vomiting   Blood pressure 122/89, pulse 89, temperature 97.1 F (36.2 C), temperature source Oral, resp. rate 18, height 5\' 9"  (1.753 m), weight 117.5 kg (259 lb), SpO2 100 %.Body mass index is 38.25 kg/m.  General Appearance: Well Groomed  Eye Contact::  Good  Speech:  Normal Rate409  Volume:  Normal  Mood:  reports feeling better today  Affect:  more reactive, appropriate  Thought Process:  Linear and Descriptions of Associations: Intact  Orientation:  Full (Time, Place, and Person)  Thought Content:  denies hallucinations, no delusions, not internally preoccupied   Suicidal Thoughts:  No denies suicidal or self injurious ideations , denies any homicidal or violent ideations , specifically also denies any violent ideations towards her husband  Homicidal Thoughts:  No  Memory:  recent and remote grossly intact   Judgement:  Other:  improving   Insight:  improving   Psychomotor Activity:  Normal   Concentration:  Good  Recall:  Good  Fund of Knowledge:Good  Language: Good  Akathisia:  Negative  Handed:  Right  AIMS (if indicated):     Assets:  Communication Skills Desire for Improvement Resilience  Sleep:  Number of Hours: 6.75  Cognition: WNL  ADL's:  Intact   Mental Status Per Nursing Assessment::   On Admission:  Suicidal ideation indicated by patient  Demographic Factors:  41 year old married female, employed, has a 33 year old child who is currently in custody of child's father, patient plans to go live with sister after discharge  Loss Factors: Marital tension, recent separation   Historical Factors: History of depression   Risk Reduction Factors:   Responsible for children under 37 years of age, Sense of responsibility to family, Employed, Living with another person, especially a relative and Positive coping skills or problem solving skills  Continued Clinical Symptoms:  Patient reports she is feeling better today, describes improved mood, affect is reactive, appropriate, no thought disorder, no suicidal or self injurious ideations, no homicidal or violent ideations, no hallucinations, no delusions, not internally preoccupied, future oriented, stating she is looking forward to seeing her son, sister, and returning to work. Patient states she intends to focus on herself and on her improvement , and as she improves she will decide whether to seek couples therapy with her husband . Denies medication side effects. Behavior on unit calm and in good control .  Cognitive Features That Contribute To Risk:  No gross cognitive deficits noted upon discharge. Is alert , attentive, and oriented x 3   Suicide Risk:  Mild:  Suicidal ideation of limited frequency, intensity, duration, and specificity.  There are no identifiable plans, no  associated intent, mild dysphoria and related symptoms, good self-control (both objective and subjective assessment), few other risk factors,  and identifiable protective factors, including available and accessible social support.  Follow-up Information    [facility] Follow up on 08/30/2016.   Specialty:  Behavioral Health Why:  at 9:15am with Dr. Modesta Messing for medication management. Contact information: [address] [place] [facility] Follow up on 08/29/2016.   Specialty:  Behavioral Health Why:  at 8am for therapy with [facility]. This was your therapist's first available appointment. You may call to see if there have been any cancellations before your scheduled appointment.  Contact information: [place] [phone]          Plan Of Care/Follow-up recommendations:  Activity:  as tolerated Diet:  Regular Tests:  NA Other:  see below  Patient is leaving unit in good spirits. Plans to return to live with her sister for the time being  Plans to follow up as above   Jenne Campus, MD 08/15/2016, 12:39 PM

## 2016-08-15 NOTE — Discharge Summary (Signed)
Physician Discharge Summary Note  Patient:  Catherine [name] is an 41 y.o., female MRN:  [id number] DOB:  [birth year] Patient phone:  [phone] (home)  Patient address:   [place] [address],  Total Time spent with patient: 30 minutes  Date of Admission:  08/10/2016 Date of Discharge: 08/15/2016  Reason for Admission:    Principal Problem: Major depressive disorder, recurrent episode, moderate ([place]) Discharge Diagnoses: Patient Active Problem List   Diagnosis Date Noted  . MDD (major depressive disorder), recurrent severe, without psychosis ([place]) [F33.2] 08/10/2016  . Major depressive disorder, recurrent episode, moderate ([place]) [F33.1] 08/02/2016  . Migraine with status migrainosus [G43.901] 02/24/2014  . Cholelithiasis with acute cholecystitis [K80.00] 11/26/2013  . GERD (gastroesophageal reflux disease) [K21.9] 11/10/2013  . Vaginitis and vulvovaginitis, unspecified [N76.0] 11/10/2013  . S/P hysterectomy [Z90.710] 10/06/2013  . Dyspareunia [IMO0002] 09/21/2013  . Excessive or frequent menstruation [N92.0] 09/21/2013    Past Psychiatric History:  see HPI  Past Medical History:  Past Medical History:  Diagnosis Date  . Allergic rhinitis   . Anxiety   . Deviated septum   . FHx: allergies   . GERD (gastroesophageal reflux disease)   . H/O scarlet fever   . Hiatal hernia   . History of kidney stones   . Hypertension   . Hypothyroidism   . Kidney stones     Past Surgical History:  Procedure Laterality Date  . ABDOMINAL HYSTERECTOMY N/A 10/06/2013   Procedure: HYSTERECTOMY ABDOMINAL;  Surgeon: Florian Buff, MD;  Location: [facility];  Service: Gynecology;  Laterality: N/A;  . BILATERAL SALPINGECTOMY Bilateral 10/06/2013   Procedure: BILATERAL SALPINGECTOMY;  Surgeon: Florian Buff, MD;  Location: [facility];  Service: Gynecology;  Laterality: Bilateral;  . BLADDER SURGERY     reports had bladder stretched as child  . CHOLECYSTECTOMY N/A 11/26/2013   Procedure:  LAPAROSCOPIC CHOLECYSTECTOMY;  Surgeon: Scherry Ran, MD;  Location: [facility];  Service: General;  Laterality: N/A;  . WISDOM TOOTH EXTRACTION     Family History:  Family History  Problem Relation Age of Onset  . Heart disease Father   . Hypertension Father   . Cancer Father   . Cirrhosis Father   . Thyroid disease Maternal Grandmother   . Cancer Maternal Grandmother   . Diabetes Maternal Grandfather   . Hypertension Maternal Grandfather   . Cancer Maternal Aunt   . Thyroid disease Maternal Aunt   . Cancer Paternal Aunt   . Migraines Neg Hx    Family Psychiatric  History:  See HPI Social History:  History  Alcohol Use No    Comment: 08-02-2016 per pt do no     History  Drug Use No    Comment: 08-02-2016 per pt no    Social History   Social History  . Marital status: Married    Spouse name: [name]   . Number of children: 1  . Years of education: 78   Occupational History  . [place] History Main Topics  . Smoking status: Current Every Day Smoker    Packs/day: 0.25    Years: 19.00    Types: Cigarettes    Last attempt to quit: 10/08/2011  . Smokeless tobacco: Never Used  . Alcohol use No     Comment: 08-02-2016 per pt do no  . Drug use: No     Comment: 08-02-2016 per pt no  . Sexual activity: Yes   Other Topics Concern  .  None   Social History Narrative   Patient is a non smoker.   Patient works full-time.   Patient is married.    Patient has a high school education.    Patient works at [employer]    Caffeine: 60+ oz a day     Hospital Course:  Catherine [name] is an 41 y.o. female. Pt initially walked-in at [facility] reported multiple stressors. Pt reported marital problem, recent bankruptcy filing as crisis triggers.  Per chart review, pt reported SI, no plan but a contradicting statement about taking overdose on Thursday was also made by patient.   Catherine [name] was admitted for Major depressive disorder, recurrent episode, moderate ([place]) and  crisis management.  Patient was treated with medications with their indications listed below in detail under Medication List.  Medical problems were identified and treated as needed.  Home medications were restarted as appropriate.  Improvement was monitored by observation and Catherine [name] daily report of symptom reduction.  Emotional and mental status was monitored by daily self inventory reports completed by Catherine [name] and clinical staff.  Patient reported continued improvement, denied any new concerns.  Patient had been compliant on medications and denied side effects.  Support and encouragement was provided.         Catherine [name] was evaluated by the treatment team for stability and plans for continued recovery upon discharge.  Patient was offered further treatment options upon discharge including Residential, Intensive Outpatient and Outpatient treatment. Patient will follow up with agency listed below for medication management and counseling.  Encouraged patient to maintain satisfactory support network and home environment.  Advised to adhere to medication compliance and outpatient treatment follow up.  Prescriptions provided.       Catherine [name] motivation was an integral factor for scheduling further treatment.  Employment, transportation, bed availability, health status, family support, and any pending legal issues were also considered during patient's hospital stay.  Upon completion of this admission the patient was both mentally and medically stable for discharge denying suicidal/homicidal ideation, auditory/visual/tactile hallucinations, delusional thoughts and paranoia.       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  PTSD  Physical Findings: AIMS:  , ,  ,  ,    CIWA:    COWS:     Musculoskeletal: Strength & Muscle Tone: within normal limits Gait & Station: normal Patient leans: N/A  Psychiatric Specialty Exam: Physical Exam  Nursing note and vitals reviewed.   ROS  Blood  pressure 122/89, pulse 89, temperature 97.1 F (36.2 C), temperature source Oral, resp. rate 18, height 5\' 9"  (1.753 m), weight 117.5 kg (259 lb), SpO2 100 %.Body mass index is 38.25 kg/m.   Have you used any form of tobacco in the last 30 days? (Cigarettes, Smokeless Tobacco, Cigars, and/or Pipes): Yes  Has this patient used any form of tobacco in the last 30 days? (Cigarettes, Smokeless Tobacco, Cigars, and/or Pipes) Yes, N/A  Blood Alcohol level:  No results found for: [facility]  Metabolic Disorder Labs:  Lab Results  Component Value Date   HGBA1C 5.4 08/10/2016   MPG 108 08/10/2016   No results found for: PROLACTIN No results found for: CHOL, TRIG, HDL, CHOLHDL, VLDL, LDLCALC  See Psychiatric Specialty Exam and Suicide Risk Assessment completed by Attending Physician prior to discharge.  Discharge destination:  Home  Is patient on multiple antipsychotic therapies at discharge:  No   Has Patient had three or more failed trials of antipsychotic monotherapy by history:  No  Recommended Plan for Multiple Antipsychotic Therapies: NA   Allergies as of 08/15/2016      Reactions   Morphine And Related Other (See Comments)   headache   Sulfa Antibiotics Other (See Comments)   Gives pt Yeast infection      Medication List    STOP taking these medications   LORazepam 0.5 MG tablet Commonly known as:  ATIVAN     TAKE these medications     Indication  buPROPion 150 MG 24 hr tablet Commonly known as:  WELLBUTRIN XL Take 1 tablet (150 mg total) by mouth daily.  Indication:  Major Depressive Disorder   DULoxetine 60 MG capsule Commonly known as:  CYMBALTA Take 1 capsule (60 mg total) by mouth daily.  Indication:  Major Depressive Disorder   hydrOXYzine 25 MG tablet Commonly known as:  ATARAX/VISTARIL Take 1 tablet (25 mg total) by mouth every 6 (six) hours as needed for anxiety.  Indication:  Anxiety Neurosis   levothyroxine 150 MCG tablet Commonly known as:  SYNTHROID,  LEVOTHROID Take 1 tablet (150 mcg total) by mouth daily before breakfast.  Indication:  Underactive Thyroid   traZODone 50 MG tablet Commonly known as:  DESYREL Take 1 tablet (50 mg total) by mouth at bedtime as needed for sleep.  Indication:  [place] [facility] Follow up on 08/30/2016.   Specialty:  Behavioral Health Why:  at 9:15am with Dr. Modesta Messing for medication management. Contact information: [address] [place] [facility] Follow up on 08/29/2016.   Specialty:  Behavioral Health Why:  at 8am for therapy with [facility]. This was your therapist's first available appointment. You may call to see if there have been any cancellations before your scheduled appointment.  Contact information: [place] [phone]          Follow-up recommendations:  Activity:  as tol Diet:  as tol  Comments:  1.  Take all your medications as prescribed.   2.  Report any adverse side effects to outpatient provider. 3.  Patient instructed to not use alcohol or illegal drugs while on prescription medicines. 4.  In the event of worsening symptoms, instructed patient to call 911, the crisis hotline or go to nearest emergency room for evaluation of symptoms.  Signed: Janett Labella, NP [facility] 08/15/2016, 11:05 AM

## 2016-08-15 NOTE — Progress Notes (Signed)
D: Pt was in her room with visitor upon initial approach.  Pt presents with anxious affect and pleasant mood.  When asked about her day, pt states she is "still here."  Pt reports that her husband brought a [employer] letter about me and some poems I wrote."  She hopes to discharge tomorrow to her sister's house.  Reports feeling safe to discharge tomorrow.  Pt denies SI/HI, denies hallucinations, reports pain from headache of 5/10.  Pt has been visible in milieu interacting with peers and staff appropriately.  Pt attended evening group.    A: Actively listened to pt and offered support and encouragement. Medication administered per order.  PRN medication administered for anxiety.  PRN medication offered for pain, pt declined.  Q15 minute safety checks maintained.  R: Pt is safe on the unit.  Pt is compliant with medications.  Pt verbally contracts for safety.  Will continue to monitor and assess.

## 2016-08-15 NOTE — Progress Notes (Signed)
Discharge Note:  Patient discharged home.  Patient denied SI and HI.  Denied A/V hallucinations. Suicide prevention information given and discussed with patient who stated she understood and had no questions.  Patient stated she received all her belongings, clothing, toiletries, prescriptions, cpap, etc.  Patient stated she appreciated all assistance received from [facility] staff.  All required discharge information given to patient before discharge.

## 2016-08-15 NOTE — Progress Notes (Signed)
D:  Patient's self inventory sheet, patient has fair sleep, sleep medication not working.  Good appetite, low energy level, good concentration.  Rated depression 4, hopeless 2, denied anxiety.  Denied withdrawals.  Denied SI.  Physical problems, pain, worst pain in past 24 hours is #8, back/hips, no pain medication.  Goal is discharge and see son.  Plans to talk to MD.  "I I need to take time off work for intensive outpatient counseling I can do that."  No discharge plans. A:  Medications administered per MD orders.  Emotional support and encouragement given patient. R:  Patient denied SI and HI, contracts for safety.  Denied A/V hallucinations.  Safety maintained with 15 minute checks.

## 2016-08-15 NOTE — BHH Group Notes (Signed)
The focus of this group is to educate the patient on the purpose and policies of crisis stabilization and provide a format to answer questions about their admission.  The group details unit policies and expectations of patients while admitted.  Patient attended 0900 nurse education orientation group this morning.  Patient actively participated and had appropriate affect.  Patient is alert.  Patient had appropriate insight and appropriate engagement.  Today patient will work on 3 goals for discharge.  

## 2016-08-20 ENCOUNTER — Encounter (HOSPITAL_COMMUNITY): Payer: Self-pay | Admitting: Psychiatry

## 2016-08-20 ENCOUNTER — Other Ambulatory Visit (HOSPITAL_COMMUNITY): Payer: Commercial Managed Care - PPO | Attending: Psychiatry | Admitting: Psychiatry

## 2016-08-20 DIAGNOSIS — J342 Deviated nasal septum: Secondary | ICD-10-CM | POA: Insufficient documentation

## 2016-08-20 DIAGNOSIS — E039 Hypothyroidism, unspecified: Secondary | ICD-10-CM | POA: Insufficient documentation

## 2016-08-20 DIAGNOSIS — Z809 Family history of malignant neoplasm, unspecified: Secondary | ICD-10-CM | POA: Insufficient documentation

## 2016-08-20 DIAGNOSIS — Z833 Family history of diabetes mellitus: Secondary | ICD-10-CM | POA: Insufficient documentation

## 2016-08-20 DIAGNOSIS — Z87442 Personal history of urinary calculi: Secondary | ICD-10-CM | POA: Insufficient documentation

## 2016-08-20 DIAGNOSIS — K219 Gastro-esophageal reflux disease without esophagitis: Secondary | ICD-10-CM | POA: Insufficient documentation

## 2016-08-20 DIAGNOSIS — Z90710 Acquired absence of both cervix and uterus: Secondary | ICD-10-CM | POA: Insufficient documentation

## 2016-08-20 DIAGNOSIS — F332 Major depressive disorder, recurrent severe without psychotic features: Secondary | ICD-10-CM | POA: Insufficient documentation

## 2016-08-20 DIAGNOSIS — I10 Essential (primary) hypertension: Secondary | ICD-10-CM | POA: Insufficient documentation

## 2016-08-20 DIAGNOSIS — Z8349 Family history of other endocrine, nutritional and metabolic diseases: Secondary | ICD-10-CM | POA: Insufficient documentation

## 2016-08-20 DIAGNOSIS — Z9049 Acquired absence of other specified parts of digestive tract: Secondary | ICD-10-CM | POA: Insufficient documentation

## 2016-08-20 DIAGNOSIS — Z885 Allergy status to narcotic agent status: Secondary | ICD-10-CM | POA: Insufficient documentation

## 2016-08-20 DIAGNOSIS — Z8249 Family history of ischemic heart disease and other diseases of the circulatory system: Secondary | ICD-10-CM | POA: Insufficient documentation

## 2016-08-20 DIAGNOSIS — Z882 Allergy status to sulfonamides status: Secondary | ICD-10-CM | POA: Insufficient documentation

## 2016-08-20 DIAGNOSIS — Z90722 Acquired absence of ovaries, bilateral: Secondary | ICD-10-CM | POA: Insufficient documentation
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
## 2016-08-21 ENCOUNTER — Other Ambulatory Visit (HOSPITAL_COMMUNITY): Payer: Commercial Managed Care - PPO

## 2016-08-21 ENCOUNTER — Ambulatory Visit (HOSPITAL_COMMUNITY): Payer: Self-pay | Admitting: Psychiatry

## 2016-08-22 ENCOUNTER — Other Ambulatory Visit (HOSPITAL_COMMUNITY): Payer: Commercial Managed Care - PPO | Admitting: Psychiatry

## 2016-08-22 DIAGNOSIS — F331 Major depressive disorder, recurrent, moderate: Secondary | ICD-10-CM

## 2016-08-22 DIAGNOSIS — Z8349 Family history of other endocrine, nutritional and metabolic diseases: Secondary | ICD-10-CM | POA: Diagnosis not present

## 2016-08-22 DIAGNOSIS — I10 Essential (primary) hypertension: Secondary | ICD-10-CM | POA: Diagnosis not present

## 2016-08-22 DIAGNOSIS — Z809 Family history of malignant neoplasm, unspecified: Secondary | ICD-10-CM | POA: Diagnosis not present

## 2016-08-22 DIAGNOSIS — J342 Deviated nasal septum: Secondary | ICD-10-CM | POA: Diagnosis not present

## 2016-08-22 DIAGNOSIS — K219 Gastro-esophageal reflux disease without esophagitis: Secondary | ICD-10-CM | POA: Diagnosis not present

## 2016-08-22 DIAGNOSIS — Z885 Allergy status to narcotic agent status: Secondary | ICD-10-CM | POA: Diagnosis not present

## 2016-08-22 DIAGNOSIS — Z8249 Family history of ischemic heart disease and other diseases of the circulatory system: Secondary | ICD-10-CM | POA: Diagnosis not present

## 2016-08-22 DIAGNOSIS — Z833 Family history of diabetes mellitus: Secondary | ICD-10-CM | POA: Diagnosis not present

## 2016-08-22 DIAGNOSIS — Z882 Allergy status to sulfonamides status: Secondary | ICD-10-CM | POA: Diagnosis not present

## 2016-08-22 DIAGNOSIS — Z90722 Acquired absence of ovaries, bilateral: Secondary | ICD-10-CM | POA: Diagnosis not present

## 2016-08-22 DIAGNOSIS — Z90710 Acquired absence of both cervix and uterus: Secondary | ICD-10-CM | POA: Diagnosis not present

## 2016-08-22 DIAGNOSIS — E039 Hypothyroidism, unspecified: Secondary | ICD-10-CM | POA: Diagnosis not present

## 2016-08-22 DIAGNOSIS — Z9049 Acquired absence of other specified parts of digestive tract: Secondary | ICD-10-CM | POA: Diagnosis not present

## 2016-08-22 DIAGNOSIS — F332 Major depressive disorder, recurrent severe without psychotic features: Secondary | ICD-10-CM | POA: Diagnosis not present

## 2016-08-22 DIAGNOSIS — Z87442 Personal history of urinary calculi: Secondary | ICD-10-CM | POA: Diagnosis not present

## 2016-08-22 NOTE — Progress Notes (Signed)
    Daily Group Progress Note  Program: IOP  Group Time: 9:00-12:00  Participation Level: Active  Behavioral Response: Appropriate  Type of Therapy:  Group Therapy  Summary of Progress: Pt. Presented with calm affect, engaged in the group process. Pt. Participated in discussion of reflective reading. Pt. Shared her poetry with the group and that she uses it as a form of journaling and to process her feelings. Pt. Participated in yoga session with Jan Fireman.     Nancie Neas, LPC

## 2016-08-23 ENCOUNTER — Other Ambulatory Visit (HOSPITAL_COMMUNITY): Payer: Commercial Managed Care - PPO

## 2016-08-26 ENCOUNTER — Other Ambulatory Visit (HOSPITAL_COMMUNITY): Payer: Commercial Managed Care - PPO | Admitting: Psychiatry

## 2016-08-26 DIAGNOSIS — F332 Major depressive disorder, recurrent severe without psychotic features: Secondary | ICD-10-CM | POA: Diagnosis not present

## 2016-08-26 DIAGNOSIS — F331 Major depressive disorder, recurrent, moderate: Secondary | ICD-10-CM

## 2016-08-27 ENCOUNTER — Other Ambulatory Visit (HOSPITAL_COMMUNITY): Payer: Commercial Managed Care - PPO | Admitting: Psychiatry

## 2016-08-27 DIAGNOSIS — F331 Major depressive disorder, recurrent, moderate: Secondary | ICD-10-CM

## 2016-08-27 DIAGNOSIS — F332 Major depressive disorder, recurrent severe without psychotic features: Secondary | ICD-10-CM | POA: Diagnosis not present

## 2016-08-28 ENCOUNTER — Other Ambulatory Visit (HOSPITAL_COMMUNITY): Payer: Commercial Managed Care - PPO | Admitting: Psychiatry
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
## 2016-08-29 ENCOUNTER — Ambulatory Visit (HOSPITAL_COMMUNITY): Payer: Self-pay | Admitting: Clinical

## 2016-08-29 ENCOUNTER — Other Ambulatory Visit (HOSPITAL_COMMUNITY): Payer: Commercial Managed Care - PPO | Admitting: Psychiatry

## 2016-08-29 DIAGNOSIS — F332 Major depressive disorder, recurrent severe without psychotic features: Secondary | ICD-10-CM | POA: Diagnosis not present

## 2016-08-29 DIAGNOSIS — F331 Major depressive disorder, recurrent, moderate: Secondary | ICD-10-CM

## 2016-08-29 NOTE — Progress Notes (Signed)
[facility] IOP DISCHARGE NOTE  Patient:  Catherine [name] DOB:  09/19/1975  Date of Admission: 08/20/2016  Date of Discharge: 08/29/2016  Reason for Admission:depression  IOP Course:attended and participated.  Says she feels much better.  Optimistic and looking forward to her life   Mental Status at Discharge:no suicidal thinking  Diagnosis: severe major depression, recurrent without psychotic features  Level of Care:  IOP  Discharge destination:  Has appointment with psychiatrist and therapist none  Comments:  none  The patient received suicide prevention pamphlet:  Yes   Donnelly Angelica, MD
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
## 2016-08-29 NOTE — Patient Instructions (Addendum)
D:  Pt successfully completed MH-IOP today.  A:  Follow up with Dr. Modesta Messing on 08-30-16 @ 9:15 a.m and Audelia Acton, LCSW (pt will call for appt).
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
## 2016-08-30 ENCOUNTER — Encounter (HOSPITAL_COMMUNITY): Payer: Self-pay | Admitting: Psychiatry

## 2016-08-30 ENCOUNTER — Ambulatory Visit (INDEPENDENT_AMBULATORY_CARE_PROVIDER_SITE_OTHER): Payer: Commercial Managed Care - PPO | Admitting: Psychiatry

## 2016-08-30 VITALS — BP 135/98 | HR 93 | Ht 69.0 in | Wt 261.8 lb

## 2016-08-30 DIAGNOSIS — F331 Major depressive disorder, recurrent, moderate: Secondary | ICD-10-CM | POA: Diagnosis not present

## 2016-08-30 DIAGNOSIS — F17210 Nicotine dependence, cigarettes, uncomplicated: Secondary | ICD-10-CM | POA: Diagnosis not present

## 2016-08-30 DIAGNOSIS — Z818 Family history of other mental and behavioral disorders: Secondary | ICD-10-CM

## 2016-08-30 DIAGNOSIS — Z79899 Other long term (current) drug therapy: Secondary | ICD-10-CM

## 2016-08-30 DIAGNOSIS — R45851 Suicidal ideations: Secondary | ICD-10-CM | POA: Diagnosis not present

## 2016-08-30 MED ORDER — BUPROPION HCL ER (XL) 300 MG PO TB24: 300.0000 mg | ORAL_TABLET | Freq: Every day | ORAL | 0 refills | Status: DC

## 2016-08-30 MED ORDER — DULOXETINE HCL 60 MG PO CPEP: 60.0000 mg | ORAL_CAPSULE | Freq: Every day | ORAL | 0 refills | Status: DC

## 2016-08-30 MED ORDER — BUPROPION HCL ER (XL) 150 MG PO TB24: 150.0000 mg | ORAL_TABLET | Freq: Every day | ORAL | 0 refills | Status: DC

## 2016-08-30 NOTE — Progress Notes (Signed)
    Daily Group Progress Note  Program: IOP  Group Time: 9:00-12:00   Participation Level: active    Behavioral Response: engaged   Type of Therapy:   group therapy   Summary of Progress: Today was Pt.s discharge date.  She shared with the group that she has moved forward with a lawyer and is excited to move through the process of receiving her child again.  The group shared how kind she was and how they will miss her. Pt. Participated in discussion with mental health association.    Nancie Neas, LPC

## 2016-08-30 NOTE — Patient Instructions (Addendum)
1. Continue duloxetine 60 mg daily  2. Increase bupropion 300 mg daily 3. Return to clinic in two weeks

## 2016-09-02 ENCOUNTER — Encounter (HOSPITAL_COMMUNITY): Payer: Self-pay | Admitting: Psychiatry

## 2016-09-02 ENCOUNTER — Other Ambulatory Visit (HOSPITAL_COMMUNITY): Payer: Commercial Managed Care - PPO
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
## 2016-09-03 ENCOUNTER — Other Ambulatory Visit (HOSPITAL_COMMUNITY): Payer: Commercial Managed Care - PPO

## 2016-09-04 ENCOUNTER — Other Ambulatory Visit (HOSPITAL_COMMUNITY): Payer: Commercial Managed Care - PPO

## 2016-09-05 ENCOUNTER — Other Ambulatory Visit (HOSPITAL_COMMUNITY): Payer: Commercial Managed Care - PPO

## 2016-09-06 ENCOUNTER — Other Ambulatory Visit (HOSPITAL_COMMUNITY): Payer: Commercial Managed Care - PPO

## 2016-09-09 ENCOUNTER — Other Ambulatory Visit (HOSPITAL_COMMUNITY): Payer: Commercial Managed Care - PPO
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
## 2016-09-10 ENCOUNTER — Encounter (HOSPITAL_COMMUNITY): Payer: Self-pay | Admitting: Psychiatry

## 2016-09-10 ENCOUNTER — Other Ambulatory Visit (HOSPITAL_COMMUNITY): Payer: Commercial Managed Care - PPO

## 2016-09-10 ENCOUNTER — Ambulatory Visit (INDEPENDENT_AMBULATORY_CARE_PROVIDER_SITE_OTHER): Payer: Commercial Managed Care - PPO | Admitting: Psychiatry

## 2016-09-10 VITALS — BP 129/97 | HR 86 | Ht 69.0 in | Wt 259.0 lb

## 2016-09-10 DIAGNOSIS — F17210 Nicotine dependence, cigarettes, uncomplicated: Secondary | ICD-10-CM | POA: Diagnosis not present

## 2016-09-10 DIAGNOSIS — E039 Hypothyroidism, unspecified: Secondary | ICD-10-CM | POA: Diagnosis not present

## 2016-09-10 DIAGNOSIS — Z818 Family history of other mental and behavioral disorders: Secondary | ICD-10-CM

## 2016-09-10 DIAGNOSIS — G4733 Obstructive sleep apnea (adult) (pediatric): Secondary | ICD-10-CM

## 2016-09-10 DIAGNOSIS — Z888 Allergy status to other drugs, medicaments and biological substances status: Secondary | ICD-10-CM

## 2016-09-10 DIAGNOSIS — N200 Calculus of kidney: Secondary | ICD-10-CM

## 2016-09-10 DIAGNOSIS — K219 Gastro-esophageal reflux disease without esophagitis: Secondary | ICD-10-CM | POA: Diagnosis not present

## 2016-09-10 DIAGNOSIS — J309 Allergic rhinitis, unspecified: Secondary | ICD-10-CM

## 2016-09-10 DIAGNOSIS — F331 Major depressive disorder, recurrent, moderate: Secondary | ICD-10-CM | POA: Diagnosis not present

## 2016-09-10 DIAGNOSIS — Z882 Allergy status to sulfonamides status: Secondary | ICD-10-CM

## 2016-09-10 MED ORDER — BUPROPION HCL ER (XL) 300 MG PO TB24: 300.0000 mg | ORAL_TABLET | Freq: Every day | ORAL | 0 refills | Status: DC

## 2016-09-10 MED ORDER — DULOXETINE HCL 60 MG PO CPEP: 60.0000 mg | ORAL_CAPSULE | Freq: Every day | ORAL | 0 refills | Status: DC

## 2016-09-10 NOTE — Patient Instructions (Signed)
1. Continue duloxetine 60 mg daily  2. Continue bupropion 300 mg daily 3. Return to clinic in one month

## 2016-09-11 ENCOUNTER — Other Ambulatory Visit (HOSPITAL_COMMUNITY): Payer: Commercial Managed Care - PPO

## 2016-09-12 ENCOUNTER — Other Ambulatory Visit (HOSPITAL_COMMUNITY): Payer: Commercial Managed Care - PPO

## 2016-09-13 ENCOUNTER — Other Ambulatory Visit (HOSPITAL_COMMUNITY): Payer: Commercial Managed Care - PPO

## 2016-09-16 ENCOUNTER — Other Ambulatory Visit (HOSPITAL_COMMUNITY): Payer: Commercial Managed Care - PPO

## 2016-09-17 ENCOUNTER — Other Ambulatory Visit (HOSPITAL_COMMUNITY): Payer: Commercial Managed Care - PPO

## 2016-09-18 ENCOUNTER — Ambulatory Visit (HOSPITAL_COMMUNITY): Payer: Self-pay | Admitting: Clinical

## 2016-09-18 ENCOUNTER — Other Ambulatory Visit (HOSPITAL_COMMUNITY): Payer: Commercial Managed Care - PPO

## 2016-09-19 ENCOUNTER — Other Ambulatory Visit (HOSPITAL_COMMUNITY): Payer: Commercial Managed Care - PPO

## 2016-09-20 ENCOUNTER — Other Ambulatory Visit (HOSPITAL_COMMUNITY): Payer: Commercial Managed Care - PPO

## 2016-09-23 ENCOUNTER — Other Ambulatory Visit (HOSPITAL_COMMUNITY): Payer: Commercial Managed Care - PPO

## 2016-09-24 ENCOUNTER — Other Ambulatory Visit (HOSPITAL_COMMUNITY): Payer: Commercial Managed Care - PPO

## 2016-09-25 ENCOUNTER — Other Ambulatory Visit (HOSPITAL_COMMUNITY): Payer: Commercial Managed Care - PPO

## 2016-09-26 ENCOUNTER — Other Ambulatory Visit (HOSPITAL_COMMUNITY): Payer: Commercial Managed Care - PPO

## 2016-09-27 ENCOUNTER — Other Ambulatory Visit (HOSPITAL_COMMUNITY): Payer: Commercial Managed Care - PPO

## 2016-09-30 ENCOUNTER — Other Ambulatory Visit (HOSPITAL_COMMUNITY): Payer: Commercial Managed Care - PPO

## 2016-10-01 ENCOUNTER — Other Ambulatory Visit (HOSPITAL_COMMUNITY): Payer: Commercial Managed Care - PPO

## 2016-10-02 ENCOUNTER — Other Ambulatory Visit (HOSPITAL_COMMUNITY): Payer: Commercial Managed Care - PPO

## 2016-10-03 ENCOUNTER — Other Ambulatory Visit (HOSPITAL_COMMUNITY): Payer: Commercial Managed Care - PPO

## 2016-10-04 ENCOUNTER — Other Ambulatory Visit (HOSPITAL_COMMUNITY): Payer: Commercial Managed Care - PPO

## 2016-10-07 ENCOUNTER — Telehealth: Payer: Self-pay

## 2016-10-07 ENCOUNTER — Other Ambulatory Visit (HOSPITAL_COMMUNITY): Payer: Commercial Managed Care - PPO

## 2016-10-07 ENCOUNTER — Ambulatory Visit: Payer: Managed Care, Other (non HMO) | Admitting: Neurology
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
## 2016-10-07 NOTE — Telephone Encounter (Signed)
Patient did not show to appt today  

## 2016-10-08 ENCOUNTER — Encounter: Payer: Self-pay | Admitting: Neurology

## 2016-10-08 ENCOUNTER — Ambulatory Visit (HOSPITAL_COMMUNITY): Payer: Self-pay | Admitting: Psychiatry

## 2016-10-08 ENCOUNTER — Other Ambulatory Visit (HOSPITAL_COMMUNITY): Payer: Commercial Managed Care - PPO

## 2016-10-09 ENCOUNTER — Other Ambulatory Visit (HOSPITAL_COMMUNITY): Payer: Commercial Managed Care - PPO

## 2016-10-10 ENCOUNTER — Other Ambulatory Visit (HOSPITAL_COMMUNITY): Payer: Commercial Managed Care - PPO

## 2016-10-11 ENCOUNTER — Other Ambulatory Visit (HOSPITAL_COMMUNITY): Payer: Commercial Managed Care - PPO

## 2016-10-14 ENCOUNTER — Other Ambulatory Visit (HOSPITAL_COMMUNITY): Payer: Commercial Managed Care - PPO
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
## 2016-11-13 ENCOUNTER — Ambulatory Visit (INDEPENDENT_AMBULATORY_CARE_PROVIDER_SITE_OTHER): Payer: Commercial Managed Care - PPO | Admitting: Psychiatry

## 2016-11-13 VITALS — BP 141/96 | HR 72 | Ht 69.02 in | Wt 256.9 lb

## 2016-11-13 DIAGNOSIS — G8929 Other chronic pain: Secondary | ICD-10-CM | POA: Diagnosis not present

## 2016-11-13 DIAGNOSIS — F331 Major depressive disorder, recurrent, moderate: Secondary | ICD-10-CM

## 2016-11-13 DIAGNOSIS — K219 Gastro-esophageal reflux disease without esophagitis: Secondary | ICD-10-CM | POA: Diagnosis not present

## 2016-11-13 DIAGNOSIS — Z79899 Other long term (current) drug therapy: Secondary | ICD-10-CM | POA: Diagnosis not present

## 2016-11-13 DIAGNOSIS — E039 Hypothyroidism, unspecified: Secondary | ICD-10-CM | POA: Diagnosis not present

## 2016-11-13 DIAGNOSIS — Z885 Allergy status to narcotic agent status: Secondary | ICD-10-CM

## 2016-11-13 DIAGNOSIS — Z87442 Personal history of urinary calculi: Secondary | ICD-10-CM

## 2016-11-13 DIAGNOSIS — G43909 Migraine, unspecified, not intractable, without status migrainosus: Secondary | ICD-10-CM

## 2016-11-13 DIAGNOSIS — G4733 Obstructive sleep apnea (adult) (pediatric): Secondary | ICD-10-CM

## 2016-11-13 DIAGNOSIS — Z818 Family history of other mental and behavioral disorders: Secondary | ICD-10-CM

## 2016-11-13 DIAGNOSIS — M545 Low back pain: Secondary | ICD-10-CM

## 2016-11-13 DIAGNOSIS — F17210 Nicotine dependence, cigarettes, uncomplicated: Secondary | ICD-10-CM

## 2016-11-13 DIAGNOSIS — Z882 Allergy status to sulfonamides status: Secondary | ICD-10-CM

## 2016-11-13 DIAGNOSIS — G4700 Insomnia, unspecified: Secondary | ICD-10-CM

## 2016-11-13 DIAGNOSIS — Z630 Problems in relationship with spouse or partner: Secondary | ICD-10-CM

## 2016-11-13 MED ORDER — VENLAFAXINE HCL ER 37.5 MG PO CP24: ORAL_CAPSULE | ORAL | 1 refills | Status: DC

## 2016-11-13 NOTE — Patient Instructions (Addendum)
1. Decrease citalopram 10 mg daily for one week, then discontinue 2. Start Effexor 37.5 mg daily for one week, then 75 mg daily 3. Increase ativan 1 mg twice a day as needed for therapy 4. Contact your therapist for appointment  5. Return to clinic for one month for 30 mins

## 2016-11-21 ENCOUNTER — Telehealth (HOSPITAL_COMMUNITY): Payer: Self-pay | Admitting: *Deleted

## 2016-11-21 NOTE — Telephone Encounter (Signed)
Per pt phone call, she stated she had discussed verbally with Dr. Modesta Messing about starting Abilify. Per pt she thinks this may help her. Per pt she would like for Dr. Modesta Messing to call her back . [phone].

## 2016-11-21 NOTE — Telephone Encounter (Signed)
Discussed with patient. Abilify to be considered in the future. She agrees with continuing Effexor only at this time.

## 2016-11-22 NOTE — Telephone Encounter (Signed)
noted 
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
## 2016-12-05 ENCOUNTER — Telehealth (HOSPITAL_COMMUNITY): Payer: Self-pay | Admitting: *Deleted

## 2016-12-05 NOTE — Telephone Encounter (Signed)
phone call from patient, she started the Effexor 75 mg. on Monday night and she is broke out in hives.

## 2016-12-06 NOTE — Telephone Encounter (Signed)
Left a voice message to call back the clinic. Also instructed to discontinue medication until her hives are gone. She may restart from lower dose if that did not cause her rash. Instructed to go to urgent care if any worsening in rash.

## 2016-12-12 ENCOUNTER — Ambulatory Visit (HOSPITAL_COMMUNITY): Payer: Self-pay | Admitting: Psychiatry

## 2016-12-13 ENCOUNTER — Ambulatory Visit (HOSPITAL_COMMUNITY): Payer: Self-pay | Admitting: Psychiatry

## 2017-01-14 ENCOUNTER — Ambulatory Visit (INDEPENDENT_AMBULATORY_CARE_PROVIDER_SITE_OTHER): Payer: Commercial Managed Care - PPO | Admitting: Obstetrics & Gynecology

## 2017-01-14 ENCOUNTER — Encounter: Payer: Self-pay | Admitting: Obstetrics & Gynecology

## 2017-01-14 VITALS — BP 128/90 | HR 73 | Ht 69.0 in | Wt 257.0 lb

## 2017-01-14 DIAGNOSIS — F332 Major depressive disorder, recurrent severe without psychotic features: Secondary | ICD-10-CM

## 2017-01-14 MED ORDER — VILAZODONE HCL 10 MG PO TABS: 10.0000 mg | ORAL_TABLET | Freq: Every day | ORAL | 0 refills | Status: DC

## 2017-01-14 MED ORDER — VILAZODONE HCL 20 MG PO TABS: 1.0000 | ORAL_TABLET | Freq: Every day | ORAL | 0 refills | Status: DC

## 2017-01-14 MED ORDER — VILAZODONE HCL 40 MG PO TABS: 40.0000 mg | ORAL_TABLET | Freq: Every day | ORAL | 11 refills | Status: DC

## 2017-01-14 NOTE — Progress Notes (Signed)
Chief Complaint  Patient presents with  . wants hormones checked    feels depressed    Blood pressure 128/90, pulse 73, height 5\' 9"  (1.753 m), weight 257 lb (116.6 kg).  41 y.o. G1P0010 No LMP recorded (lmp unknown). Patient has had a hysterectomy. The current method of family planning is status post hysterectomy.  Outpatient Encounter Medications as of 01/14/2017  Medication Sig  . HYDROcodone-acetaminophen (NORCO/VICODIN) 5-325 MG tablet Take 1 tablet by mouth as needed.   [name] LORazepam (ATIVAN) 1 MG tablet Take 1 mg by mouth 3 (three) times daily as needed.   . [DISCONTINUED] levothyroxine (SYNTHROID, LEVOTHROID) 150 MCG tablet Take 1 tablet (150 mcg total) by mouth daily before breakfast.  . Vilazodone HCl (VIIBRYD) 10 MG TABS Take 1 tablet (10 mg total) by mouth daily.  . [DISCONTINUED] hydrOXYzine (ATARAX/VISTARIL) 25 MG tablet Take 1 tablet (25 mg total) by mouth every 6 (six) hours as needed for anxiety.  . [DISCONTINUED] venlafaxine XR (EFFEXOR-XR) 37.5 MG 24 hr capsule 37.5 mg daily for one week, then 75 mg daily  . [DISCONTINUED] Vilazodone HCl (VIIBRYD) 20 MG TABS Take 1 tablet (20 mg total) by mouth daily.  . [DISCONTINUED] Vilazodone HCl (VIIBRYD) 40 MG TABS Take 1 tablet (40 mg total) by mouth daily.   No facility-administered encounter medications on file as of 01/14/2017.     Subjective Ehrke comes in really with depression and she is had this before in has been on a multitude of medications in the past none of which seem have been very effective Her biggest concern was is a possible that she is menopausal Any things possible but certainly she is not having vasomotor symptoms and with her strong history don't think is related however I will check labs to reassure her Additionally we'll check her thyroid function She is not currently suicidal  Objective  Pertinent ROS   Labs or studies     Impression Diagnoses this Encounter::   ICD-10-CM   1. Severe  episode of recurrent major depressive disorder, without psychotic features ([place]) F33.2 Estradiol    FSH    TSH    Established relevant diagnosis(es):   Plan/Recommendations: Meds ordered this encounter  Medications  . Vilazodone HCl (VIIBRYD) 10 MG TABS    Sig: Take 1 tablet (10 mg total) by mouth daily.    Dispense:  7 tablet    Refill:  0  . DISCONTD: Vilazodone HCl (VIIBRYD) 20 MG TABS    Sig: Take 1 tablet (20 mg total) by mouth daily.    Dispense:  7 tablet    Refill:  0  . DISCONTD: Vilazodone HCl (VIIBRYD) 40 MG TABS    Sig: Take 1 tablet (40 mg total) by mouth daily.    Dispense:  30 tablet    Refill:  11    Labs or Scans Ordered: Orders Placed This Encounter  Procedures  . Estradiol  . [place]  . TSH    Management:: Patient is been on a number of different medications before for anxiety and depression none of which have been successful I'm going to try her on Viibryd and also check her estradiol and FSH and TSH to reassure her as far as her ovarian function goes  Follow up Return if symptoms worsen or fail to improve.       All questions were answered.  Past Medical History:  Diagnosis Date  . Allergic rhinitis   . Anxiety   . Deviated septum   .  FHx: allergies   . GERD (gastroesophageal reflux disease)   . H/O scarlet fever   . Hiatal hernia   . History of kidney stones   . Hyperlipidemia   . Hypertension   . Hypothyroidism   . Kidney stones   . MDD (major depressive disorder)   . Meralgia paresthetica   . PTSD (post-traumatic stress disorder)     Past Surgical History:  Procedure Laterality Date  . ABDOMINAL HYSTERECTOMY N/A 10/06/2013   Procedure: HYSTERECTOMY ABDOMINAL;  Surgeon: Florian Buff, MD;  Location: [facility];  Service: Gynecology;  Laterality: N/A;  . BILATERAL SALPINGECTOMY Bilateral 10/06/2013   Procedure: BILATERAL SALPINGECTOMY;  Surgeon: Florian Buff, MD;  Location: [facility];  Service: Gynecology;  Laterality: Bilateral;  .  BLADDER SURGERY     reports had bladder stretched as child  . CHOLECYSTECTOMY N/A 11/26/2013   Procedure: LAPAROSCOPIC CHOLECYSTECTOMY;  Surgeon: Scherry Ran, MD;  Location: [facility];  Service: General;  Laterality: N/A;  . WISDOM TOOTH EXTRACTION      OB History    Gravida Para Term Preterm AB Living   1 0 0 0 1 0   SAB TAB Ectopic Multiple Live Births   1 0 0 0        Allergies  Allergen Reactions  . Effexor Xr [Venlafaxine Hcl Er] Hives  . Morphine And Related Other (See Comments)    headache  . Sulfa Antibiotics Other (See Comments)    Gives pt Yeast infection    Social History   Socioeconomic History  . Marital status: Legally Separated    Spouse name: [name]   . Number of children: 1  . Years of education: 74  . Highest education level: None  Social Needs  . Financial resource strain: None  . Food insecurity - worry: None  . Food insecurity - inability: None  . Transportation needs - medical: None  . Transportation needs - non-medical: None  Occupational History  . Occupation: Production manager  Tobacco Use  . Smoking status: Current Every Day Smoker    Packs/day: 0.50    Years: 24.00    Pack years: 12.00    Types: Cigarettes    Last attempt to quit: 10/08/2011    Years since quitting: 5.5  . Smokeless tobacco: Never Used  Substance and Sexual Activity  . Alcohol use: No    Alcohol/week: 0.0 oz    Comment: 08-02-2016 per pt do no  . Drug use: No    Comment: 08-02-2016 per pt no  . Sexual activity: Not Currently    Birth control/protection: Surgical    Comment: hyst  Other Topics Concern  . None  Social History Narrative   Patient is a non smoker.   Patient works full-time.   Patient is married.    Patient has a high school education.    Patient works at [employer]    Caffeine: 60+ oz a day     Family History  Problem Relation Age of Onset  . Heart disease Father   . Hypertension Father   . Cancer Father   . Cirrhosis Father   . Thyroid  disease Maternal Grandmother   . Cancer Maternal Grandmother   . Diabetes Maternal Grandfather   . Hypertension Maternal Grandfather   . Cancer Maternal Aunt   . Thyroid disease Maternal Aunt   . Depression Maternal Aunt   . Cancer Paternal Aunt   . Migraines Neg Hx

## 2017-01-15 LAB — ESTRADIOL: ESTRADIOL: 104 pg/mL

## 2017-01-15 LAB — FOLLICLE STIMULATING HORMONE: FSH: 14.2 m[IU]/mL

## 2017-01-15 LAB — TSH: TSH: 2.99 u[IU]/mL (ref 0.450–4.500)

## 2017-01-28 ENCOUNTER — Encounter: Payer: Self-pay | Admitting: "Endocrinology

## 2017-01-28 ENCOUNTER — Ambulatory Visit (INDEPENDENT_AMBULATORY_CARE_PROVIDER_SITE_OTHER): Payer: Commercial Managed Care - PPO | Admitting: "Endocrinology

## 2017-01-28 VITALS — BP 154/78 | HR 80 | Ht 69.0 in | Wt 258.0 lb

## 2017-01-28 DIAGNOSIS — E039 Hypothyroidism, unspecified: Secondary | ICD-10-CM | POA: Insufficient documentation

## 2017-01-28 LAB — T4, FREE: Free T4: 1 ng/dL (ref 0.8–1.8)

## 2017-01-28 LAB — TSH: TSH: 5.16 mIU/L — ABNORMAL HIGH

## 2017-01-28 LAB — T3, FREE: T3 FREE: 2.1 pg/mL — AB (ref 2.3–4.2)

## 2017-01-28 NOTE — Progress Notes (Signed)
Subjective:    Patient ID: Catherine [name], female    DOB: 11-29-1975, PCP Barry Dienes, NP   Past Medical History:  Diagnosis Date  . Allergic rhinitis   . Anxiety   . Deviated septum   . FHx: allergies   . GERD (gastroesophageal reflux disease)   . H/O scarlet fever   . Hiatal hernia   . History of kidney stones   . Hyperlipidemia   . Hypertension   . Hypothyroidism   . Kidney stones   . MDD (major depressive disorder)   . Meralgia paresthetica   . PTSD (post-traumatic stress disorder)    Past Surgical History:  Procedure Laterality Date  . ABDOMINAL HYSTERECTOMY N/A 10/06/2013   Procedure: HYSTERECTOMY ABDOMINAL;  Surgeon: Florian Buff, MD;  Location: [facility];  Service: Gynecology;  Laterality: N/A;  . BILATERAL SALPINGECTOMY Bilateral 10/06/2013   Procedure: BILATERAL SALPINGECTOMY;  Surgeon: Florian Buff, MD;  Location: [facility];  Service: Gynecology;  Laterality: Bilateral;  . BLADDER SURGERY     reports had bladder stretched as child  . CHOLECYSTECTOMY N/A 11/26/2013   Procedure: LAPAROSCOPIC CHOLECYSTECTOMY;  Surgeon: Scherry Ran, MD;  Location: [facility];  Service: General;  Laterality: N/A;  . WISDOM TOOTH EXTRACTION     Social History   Social History  . Marital status: Married    Spouse name: [name]   . Number of children: 1  . Years of education: 72   Occupational History  . [place] History Main Topics  . Smoking status: Current Every Day Smoker    Packs/day: 0.50    Years: 24.00    Types: Cigarettes    Last attempt to quit: 10/08/2011  . Smokeless tobacco: Never Used  . Alcohol use No     Comment: 08-02-2016 per pt do no  . Drug use: No     Comment: 08-02-2016 per pt no  . Sexual activity: Not Currently    Birth control/ protection: Surgical     Comment: hyst   Other Topics Concern  . None   Social History Narrative   Patient is a non smoker.   Patient works full-time.   Patient is married.    Patient has a high  school education.    Patient works at [employer]    Caffeine: 60+ oz a day    Outpatient Encounter Prescriptions as of 01/28/2017  Medication Sig  . levothyroxine (SYNTHROID, LEVOTHROID) 150 MCG tablet Take 1 tablet (150 mcg total) by mouth daily before breakfast.  . Vilazodone HCl (VIIBRYD) 10 MG TABS Take 1 tablet (10 mg total) by mouth daily.  [name] HYDROcodone-acetaminophen (NORCO/VICODIN) 5-325 MG tablet Take 1 tablet by mouth as needed.   [name] LORazepam (ATIVAN) 1 MG tablet Take 1 mg by mouth 3 (three) times daily as needed.   . [DISCONTINUED] Vilazodone HCl (VIIBRYD) 20 MG TABS Take 1 tablet (20 mg total) by mouth daily.  . [DISCONTINUED] Vilazodone HCl (VIIBRYD) 40 MG TABS Take 1 tablet (40 mg total) by mouth daily.   No facility-administered encounter medications on file as of 01/28/2017.    ALLERGIES: Allergies  Allergen Reactions  . Effexor Xr [Venlafaxine Hcl Er] Hives  . Morphine And Related Other (See Comments)    headache  . Sulfa Antibiotics Other (See Comments)    Gives pt Yeast infection    VACCINATION STATUS: Immunization History  Administered Date(s) Administered  . Influenza,inj,Quad PF,6+ Mos 08/11/2016  . Pneumococcal Polysaccharide-23 08/11/2016  HPI 41 year old female patient with medical history as above. She is being seen in consultation for history of hypothyroidism requested by Barry Dienes, NP. - She reports that she was diagnosed with hypothyroidism approximately at age 53. She was treated with various doses of levothyroxine currently at 150 g by mouth every morning. -She reports compliance. She has multiple complaints including inability to lose weight, depression, and fatigue. -She denies complaints of heat nor cold intolerance. -She denies any history of goiter nor family history of thyroid cancer. She has family history of thyroid dysfunction in multiple members including her mother, sibling and a grandparent. -She denies history of thyroid surgery nor  antithyroid therapy.  Review of Systems  Constitutional: + Long-standing overweight/obesity, +fatigue, no subjective hyperthermia, no subjective hypothermia Eyes: no blurry vision, no xerophthalmia ENT: no sore throat, no nodules palpated in throat, no dysphagia/odynophagia, no hoarseness Cardiovascular: no Chest Pain, no Shortness of Breath, no palpitations, no leg swelling Respiratory: no cough, + exertional SOB Gastrointestinal: no Nausea/Vomiting/Diarhhea Musculoskeletal: no muscle/joint aches Skin: no rashes Neurological: no tremors, no numbness, no tingling, no dizziness Psychiatric: + depression, + anxiety  Objective:    BP (!) 154/78   Pulse 80   Ht 5\' 9"  (1.753 m)   Wt 258 lb (117 kg)   LMP  (LMP Unknown)   BMI 38.10 kg/m   Wt Readings from Last 3 Encounters:  01/28/17 258 lb (117 kg)  01/14/17 257 lb (116.6 kg)  04/08/16 260 lb (117.9 kg)    Physical Exam  Constitutional: +obese for height, not in acute distress, normal state of mind Eyes: PERRLA, EOMI, no exophthalmos ENT: moist mucous membranes, + palpable thyromegaly, no cervical lymphadenopathy Cardiovascular: normal precordial activity, Regular Rate and Rhythm, no Murmur/Rubs/Gallops Respiratory:  adequate breathing efforts, no gross chest deformity, Clear to auscultation bilaterally Gastrointestinal: abdomen soft, Non -tender, No distension, Bowel Sounds present Musculoskeletal: no gross deformities, strength intact in all four extremities Skin: moist, warm, no rashes, + tattoos Neurological: no tremor with outstretched hands, Deep tendon reflexes normal in all four extremities.  CMP ( most recent) CMP     Component Value Date/Time   NA 139 08/10/2016 1817   K 3.5 08/10/2016 1817   CL 107 08/10/2016 1817   CO2 25 08/10/2016 1817   GLUCOSE 106 (H) 08/10/2016 1817   BUN 11 08/10/2016 1817   CREATININE 0.90 08/10/2016 1817   CALCIUM 9.3 08/10/2016 1817   PROT 8.2 (H) 08/10/2016 1817   ALBUMIN 4.2  08/10/2016 1817   AST 17 08/10/2016 1817   ALT 18 08/10/2016 1817   ALKPHOS 90 08/10/2016 1817   BILITOT 0.4 08/10/2016 1817   GFRNONAA >60 08/10/2016 1817   GFRAA >60 08/10/2016 1817     Diabetic Labs (most recent): Lab Results  Component Value Date   HGBA1C 5.4 08/10/2016    Results for AILANIE, RUTTAN (MRN [id number]) as of 01/28/2017 13:36  Ref. Range 11/26/2013 07:00 08/10/2016 18:17 01/14/2017 11:54  TSH Latest Ref Range: 0.450 - 4.500 uIU/mL 6.530 (H) 2.715 2.990  Triiodothyronine (T3) Latest Ref Range: 80.0 - 204.0 ng/dl 88.0    Thyroxine (T4) Latest Ref Range: 5.0 - 12.5 ug/dL 9.9        Assessment & Plan:   1. Hypothyroidism, unspecified type - Patient is being seen at the kind request of her PCP Barry Dienes, NP. - I have reviewed her thyroid  records and clinically evaluated this patient. - She has long-standing hypothyroidism requiring thyroid hormone replacement. Her most recent  thyroid function tests are not complete. I'll proceed to obtain more complete thyroid function tests including TSH, free T4, and free T3, 25-hydroxy vitamin D. - I will also obtain a baseline thyroid ultrasound. - She will return in 1 week for reevaluation and treatment decision. -In the meantime, I have advised her to stay on levothyroxine 150 g by mouth every morning.  - We discussed about correct intake of levothyroxine, at fasting, with water, separated by at least 30 minutes from breakfast, and separated by more than 4 hours from calcium, iron, multivitamins, acid reflux medications (PPIs). -Patient is made aware of the fact that thyroid hormone replacement is needed for life, dose to be adjusted by periodic monitoring of thyroid function tests.  2. Morbid obesity ([place]) - She will greatly benefit from weight loss.  - With her permission, we discussed diet in detail, reviewing each of her food choices and advised for alternatives (also given a list of healthy substitutions, the patient  instructions section).   - I have counseled her on diet management  by adopting a carbohydrate restricted/protein rich diet.  Suggestion is made for her to avoid simple carbohydrates  from her diet including Cakes, Sweet Desserts, Ice Cream, Soda (diet and regular), Sweet Tea, Candies, Chips, Cookies, Store Bought Juices, Alcohol in Excess of  1-2 drinks a day, Artificial Sweeteners, and "Sugar-free" Products. This will help patient to have stable blood glucose profile and potentially avoid unintended weight gain.  -She is advised to stick to a routine mealtimes to eat 3 balanced meals  a day and avoid unnecessary snacks ( to snack only to correct hypoglycemia).   - I advised patient to maintain close follow up with Barry Dienes, NP for primary care needs. Follow up plan: Return in about 2 weeks (around 02/11/2017) for labs today, Thyroid / Neck Ultrasound.  Glade Lloyd, MD Phone: [phone]  Fax: [phone]   This note was partially dictated with voice recognition software. Similar sounding words can be transcribed inadequately or may not  be corrected upon review. 01/28/2017, 1:30 PM

## 2017-01-29 ENCOUNTER — Encounter: Payer: Self-pay | Admitting: Obstetrics & Gynecology

## 2017-01-29 LAB — VITAMIN D 25 HYDROXY (VIT D DEFICIENCY, FRACTURES): VIT D 25 HYDROXY: 18 ng/mL — AB (ref 30–100)

## 2017-02-04 ENCOUNTER — Ambulatory Visit (HOSPITAL_COMMUNITY)
Admission: RE | Admit: 2017-02-04 | Discharge: 2017-02-04 | Disposition: A | Discharge status: Home / Self Care | Payer: Commercial Managed Care - PPO | Source: Ambulatory Visit | Attending: "Endocrinology | Admitting: "Endocrinology

## 2017-02-04 DIAGNOSIS — E039 Hypothyroidism, unspecified: Secondary | ICD-10-CM | POA: Diagnosis present

## 2017-02-18 ENCOUNTER — Encounter: Payer: Self-pay | Admitting: "Endocrinology

## 2017-02-18 ENCOUNTER — Ambulatory Visit (INDEPENDENT_AMBULATORY_CARE_PROVIDER_SITE_OTHER): Payer: Commercial Managed Care - PPO | Admitting: "Endocrinology

## 2017-02-18 VITALS — BP 123/88 | HR 77 | Ht 69.0 in | Wt 255.0 lb

## 2017-02-18 DIAGNOSIS — E039 Hypothyroidism, unspecified: Secondary | ICD-10-CM

## 2017-02-18 DIAGNOSIS — E559 Vitamin D deficiency, unspecified: Secondary | ICD-10-CM | POA: Diagnosis not present

## 2017-02-18 MED ORDER — LEVOTHYROXINE SODIUM 175 MCG PO TABS: 175.0000 ug | ORAL_TABLET | Freq: Every day | ORAL | 6 refills | Status: DC

## 2017-02-18 MED ORDER — VITAMIN D (ERGOCALCIFEROL) 1.25 MG (50000 UNIT) PO CAPS: 50000.0000 [IU] | ORAL_CAPSULE | ORAL | 0 refills | Status: DC

## 2017-02-18 NOTE — Progress Notes (Signed)
Subjective:    Patient ID: Catherine [name], female    DOB: 04/21/1976, PCP Barry Dienes, NP   Past Medical History:  Diagnosis Date  . Allergic rhinitis   . Anxiety   . Deviated septum   . FHx: allergies   . GERD (gastroesophageal reflux disease)   . H/O scarlet fever   . Hiatal hernia   . History of kidney stones   . Hyperlipidemia   . Hypertension   . Hypothyroidism   . Kidney stones   . MDD (major depressive disorder)   . Meralgia paresthetica   . PTSD (post-traumatic stress disorder)    Past Surgical History:  Procedure Laterality Date  . ABDOMINAL HYSTERECTOMY N/A 10/06/2013   Procedure: HYSTERECTOMY ABDOMINAL;  Surgeon: Florian Buff, MD;  Location: [facility];  Service: Gynecology;  Laterality: N/A;  . BILATERAL SALPINGECTOMY Bilateral 10/06/2013   Procedure: BILATERAL SALPINGECTOMY;  Surgeon: Florian Buff, MD;  Location: [facility];  Service: Gynecology;  Laterality: Bilateral;  . BLADDER SURGERY     reports had bladder stretched as child  . CHOLECYSTECTOMY N/A 11/26/2013   Procedure: LAPAROSCOPIC CHOLECYSTECTOMY;  Surgeon: Scherry Ran, MD;  Location: [facility];  Service: General;  Laterality: N/A;  . WISDOM TOOTH EXTRACTION     Social History   Social History  . Marital status: Legally Separated    Spouse name: [name]   . Number of children: 1  . Years of education: 55   Occupational History  . [place] History Main Topics  . Smoking status: Current Every Day Smoker    Packs/day: 0.50    Years: 24.00    Types: Cigarettes    Last attempt to quit: 10/08/2011  . Smokeless tobacco: Never Used  . Alcohol use No     Comment: 08-02-2016 per pt do no  . Drug use: No     Comment: 08-02-2016 per pt no  . Sexual activity: Not Currently    Birth control/ protection: Surgical     Comment: hyst   Other Topics Concern  . None   Social History Narrative   Patient is a non smoker.   Patient works full-time.   Patient is married.    Patient has  a high school education.    Patient works at [employer]    Caffeine: 60+ oz a day    Outpatient Encounter Prescriptions as of 02/18/2017  Medication Sig  . HYDROcodone-acetaminophen (NORCO/VICODIN) 5-325 MG tablet Take 1 tablet by mouth as needed.   [name] levothyroxine (SYNTHROID, LEVOTHROID) 175 MCG tablet Take 1 tablet (175 mcg total) by mouth daily before breakfast.  . LORazepam (ATIVAN) 1 MG tablet Take 1 mg by mouth 3 (three) times daily as needed.   . Vilazodone HCl (VIIBRYD) 10 MG TABS Take 1 tablet (10 mg total) by mouth daily.  . Vitamin D, Ergocalciferol, (DRISDOL) 50000 units CAPS capsule Take 1 capsule (50,000 Units total) by mouth every 7 (seven) days.  . [DISCONTINUED] levothyroxine (SYNTHROID, LEVOTHROID) 150 MCG tablet Take 1 tablet (150 mcg total) by mouth daily before breakfast.   No facility-administered encounter medications on file as of 02/18/2017.    ALLERGIES: Allergies  Allergen Reactions  . Effexor Xr [Venlafaxine Hcl Er] Hives  . Morphine And Related Other (See Comments)    headache  . Sulfa Antibiotics Other (See Comments)    Gives pt Yeast infection    VACCINATION STATUS: Immunization History  Administered Date(s) Administered  . Influenza,inj,Quad PF,6+  Mos 08/11/2016  . Pneumococcal Polysaccharide-23 08/11/2016    HPI 41 year old female patient with medical history as above. She is being seen in  Follow-up for history of hypothyroidism. - She reports that she was diagnosed with hypothyroidism approximately at age 51. She was treated with various doses of levothyroxine currently at 150 g by mouth every morning. -She reports compliance. She has multiple complaints including inability to lose weight, depression, and fatigue. She is accompanied by her husband who corroborates the same history. She is following with her psychiatrist for depression.  -She denies complaints of heat nor cold intolerance. -She denies any history of goiter nor family history of thyroid  cancer. She has family history of thyroid dysfunction in multiple members including her mother, sibling and a grandparent. -She denies history of thyroid surgery nor antithyroid therapy.  Review of Systems  Constitutional: + Long-standing overweight/obesity- lost 3 pounds since last visit,  + fatigue, no subjective hyperthermia, no subjective hypothermia Eyes: no blurry vision, no xerophthalmia ENT: no sore throat, no nodules palpated in throat, no dysphagia/odynophagia, no hoarseness Cardiovascular: no Chest Pain, no Shortness of Breath, no palpitations, no leg swelling Respiratory: no cough, + exertional SOB Gastrointestinal: no Nausea/Vomiting/Diarhhea Musculoskeletal: no muscle/joint aches Skin: no rashes Neurological: no tremors, no numbness, no tingling, no dizziness Psychiatric: + depression, - anxiety  Objective:    BP 123/88   Pulse 77   Ht 5\' 9"  (1.753 m)   Wt 255 lb (115.7 kg)   LMP  (LMP Unknown)   BMI 37.66 kg/m   Wt Readings from Last 3 Encounters:  02/18/17 255 lb (115.7 kg)  01/28/17 258 lb (117 kg)  01/14/17 257 lb (116.6 kg)    Physical Exam  Constitutional: +obese for height, not in acute distress, normal state of mind Eyes: PERRLA, EOMI, no exophthalmos ENT: moist mucous membranes, + palpable thyromegaly, no cervical lymphadenopathy Cardiovascular: normal precordial activity, Regular Rate and Rhythm, no Murmur/Rubs/Gallops Respiratory:  adequate breathing efforts, no gross chest deformity, Clear to auscultation bilaterally Gastrointestinal: abdomen soft, Non -tender, No distension, Bowel Sounds present Musculoskeletal: no gross deformities, strength intact in all four extremities Skin: moist, warm, no rashes, + tattoos Neurological: no tremor with outstretched hands, Deep tendon reflexes normal in all four extremities.  CMP ( most recent) CMP     Component Value Date/Time   NA 139 08/10/2016 1817   K 3.5 08/10/2016 1817   CL 107 08/10/2016 1817    CO2 25 08/10/2016 1817   GLUCOSE 106 (H) 08/10/2016 1817   BUN 11 08/10/2016 1817   CREATININE 0.90 08/10/2016 1817   CALCIUM 9.3 08/10/2016 1817   PROT 8.2 (H) 08/10/2016 1817   ALBUMIN 4.2 08/10/2016 1817   AST 17 08/10/2016 1817   ALT 18 08/10/2016 1817   ALKPHOS 90 08/10/2016 1817   BILITOT 0.4 08/10/2016 1817   GFRNONAA >60 08/10/2016 1817   GFRAA >60 08/10/2016 1817    Diabetic Labs (most recent): Lab Results  Component Value Date   HGBA1C 5.4 08/10/2016    Results for ATAYA, MURDY (MRN [id number]) as of 02/18/2017 10:37  Ref. Range 01/28/2017 10:16  TSH Latest Units: mIU/L 5.16 (H)  Triiodothyronine,Free,Serum Latest Ref Range: 2.3 - 4.2 pg/mL 2.1 (L)  T4,Free(Direct) Latest Ref Range: 0.8 - 1.8 ng/dL 1.0    Assessment & Plan:   1. Hypothyroidism  -Based on her repeat thyroid function tests, she will benefit from slight increase in her levothyroxine.  - I will increase her levothyroxine to 175 g  by mouth every morning.   - We discussed about correct intake of levothyroxine, at fasting, with water, separated by at least 30 minutes from breakfast, and separated by more than 4 hours from calcium, iron, multivitamins, acid reflux medications (PPIs). -Patient is made aware of the fact that thyroid hormone replacement is needed for life, dose to be adjusted by periodic monitoring of thyroid function tests.  2. Morbid obesity ([place])  - She will greatly benefit from weight loss.  - With her permission, we discussed diet in detail, reviewing each of her food choices and advised for alternatives (also given a list of healthy substitutions, the patient instructions section).   - I have counseled her on diet management  by adopting a carbohydrate restricted/protein rich diet.  Suggestion is made for her to avoid simple carbohydrates  from her diet including Cakes, Sweet Desserts, Ice Cream, Soda (diet and regular), Sweet Tea, Candies, Chips, Cookies, Store Bought Juices,  Alcohol in Excess of  1-2 drinks a day, Artificial Sweeteners, and "Sugar-free" Products. This will help patient to have stable blood glucose profile and potentially avoid unintended weight gain.  -She is advised to stick to a routine mealtimes to eat 3 balanced meals  a day and avoid unnecessary snacks ( to snack only to correct hypoglycemia).   3. Vitamin D deficiency:  - This is a new diagnosis for her. I will initiate vitamin D 50,000 units weekly for the next 12 weeks.  - I advised patient to maintain close follow up with Barry Dienes, NP for primary care needs. Follow up plan: Return in about 6 months (around 08/18/2017) for follow up with pre-visit labs.  Glade Lloyd, MD Phone: [phone]  Fax: [phone]   This note was partially dictated with voice recognition software. Similar sounding words can be transcribed inadequately or may not  be corrected upon review. 02/18/2017, 10:55 AM

## 2017-03-04 ENCOUNTER — Telehealth: Payer: Self-pay

## 2017-03-04 ENCOUNTER — Encounter: Payer: Self-pay | Admitting: "Endocrinology

## 2017-03-04 NOTE — Telephone Encounter (Signed)
Message from pt:  Good afternoon.    I was wondering if you could switch my medicine to Armour instead of Levothyroxin/Synthroid?

## 2017-03-05 NOTE — Telephone Encounter (Signed)
Levothyroxine/Synthroid are much better than Armour. Armour will not allow you to reach a steady sate, simply because it is a crude product- no advantage over Levothyroxine /Synthroid.

## 2017-03-06 NOTE — Telephone Encounter (Signed)
Left message for pt to call back  °

## 2017-05-07 ENCOUNTER — Other Ambulatory Visit: Payer: Self-pay | Admitting: Emergency Medicine

## 2017-05-07 DIAGNOSIS — M545 Low back pain: Secondary | ICD-10-CM

## 2017-05-08 ENCOUNTER — Encounter: Payer: Self-pay | Admitting: Obstetrics & Gynecology

## 2017-05-21 ENCOUNTER — Ambulatory Visit
Admission: RE | Admit: 2017-05-21 | Discharge: 2017-05-21 | Disposition: A | Discharge status: Home / Self Care | Payer: Commercial Managed Care - PPO | Source: Ambulatory Visit | Attending: Emergency Medicine | Admitting: Emergency Medicine

## 2017-05-21 DIAGNOSIS — M545 Low back pain: Secondary | ICD-10-CM

## 2017-07-25 ENCOUNTER — Ambulatory Visit (HOSPITAL_COMMUNITY)
Admission: EM | Admit: 2017-07-25 | Discharge: 2017-07-25 | Disposition: A | Discharge status: Home / Self Care | Payer: Commercial Managed Care - PPO | Attending: Family Medicine | Admitting: Family Medicine

## 2017-07-25 ENCOUNTER — Encounter (HOSPITAL_COMMUNITY): Payer: Self-pay | Admitting: Emergency Medicine

## 2017-07-25 ENCOUNTER — Ambulatory Visit (INDEPENDENT_AMBULATORY_CARE_PROVIDER_SITE_OTHER): Payer: Commercial Managed Care - PPO

## 2017-07-25 ENCOUNTER — Other Ambulatory Visit: Payer: Self-pay

## 2017-07-25 DIAGNOSIS — T07XXXA Unspecified multiple injuries, initial encounter: Secondary | ICD-10-CM | POA: Diagnosis not present

## 2017-07-25 DIAGNOSIS — J40 Bronchitis, not specified as acute or chronic: Secondary | ICD-10-CM | POA: Diagnosis not present

## 2017-07-25 DIAGNOSIS — R0789 Other chest pain: Secondary | ICD-10-CM | POA: Diagnosis not present

## 2017-07-25 DIAGNOSIS — T148XXA Other injury of unspecified body region, initial encounter: Secondary | ICD-10-CM

## 2017-07-25 DIAGNOSIS — M25511 Pain in right shoulder: Secondary | ICD-10-CM | POA: Diagnosis not present

## 2017-07-25 MED ORDER — PREDNISONE 20 MG PO TABS: ORAL_TABLET | ORAL | 0 refills | Status: DC
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
## 2017-07-25 NOTE — Discharge Instructions (Signed)
X-rays show no fractures. The discomfort you're having has to do with contusions and abrasions and should heal and 1-2 weeks.  Continue the over-the-counter medicine as needed.
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
## 2017-08-18 ENCOUNTER — Encounter: Payer: Self-pay | Admitting: "Endocrinology

## 2017-08-18 ENCOUNTER — Ambulatory Visit: Payer: Commercial Managed Care - PPO | Admitting: "Endocrinology

## 2017-10-07 ENCOUNTER — Encounter: Payer: Self-pay | Admitting: "Endocrinology

## 2017-10-13 ENCOUNTER — Ambulatory Visit: Payer: Commercial Managed Care - PPO | Admitting: Family Medicine

## 2017-11-14 ENCOUNTER — Ambulatory Visit
Admission: RE | Admit: 2017-11-14 | Discharge: 2017-11-14 | Disposition: A | Discharge status: Home / Self Care | Payer: Worker's Compensation | Source: Ambulatory Visit | Attending: Emergency Medicine | Admitting: Emergency Medicine

## 2017-11-14 ENCOUNTER — Other Ambulatory Visit: Payer: Self-pay | Admitting: Emergency Medicine

## 2017-11-14 DIAGNOSIS — R609 Edema, unspecified: Secondary | ICD-10-CM

## 2017-11-14 DIAGNOSIS — M25531 Pain in right wrist: Secondary | ICD-10-CM

## 2017-12-03 ENCOUNTER — Encounter (HOSPITAL_COMMUNITY): Payer: Self-pay

## 2017-12-03 ENCOUNTER — Telehealth (HOSPITAL_COMMUNITY): Payer: Self-pay | Admitting: Emergency Medicine

## 2017-12-03 ENCOUNTER — Telehealth (HOSPITAL_COMMUNITY): Payer: Self-pay

## 2017-12-03 ENCOUNTER — Ambulatory Visit (HOSPITAL_COMMUNITY)
Admission: EM | Admit: 2017-12-03 | Discharge: 2017-12-03 | Disposition: A | Discharge status: Home / Self Care | Payer: Commercial Managed Care - PPO | Attending: Family Medicine | Admitting: Family Medicine

## 2017-12-03 DIAGNOSIS — M545 Low back pain, unspecified: Secondary | ICD-10-CM

## 2017-12-03 MED ORDER — PREDNISONE 10 MG (21) PO TBPK: ORAL_TABLET | Freq: Every day | ORAL | 0 refills | Status: DC

## 2017-12-03 MED ORDER — CYCLOBENZAPRINE HCL 5 MG PO TABS: 5.0000 mg | ORAL_TABLET | Freq: Every evening | ORAL | 0 refills | Status: DC | PRN

## 2017-12-03 MED ORDER — KETOROLAC TROMETHAMINE 60 MG/2ML IM SOLN
60.0000 mg | Freq: Once | INTRAMUSCULAR | Status: AC
  Administered 2017-12-03: 60 mg via INTRAMUSCULAR

## 2017-12-03 MED ORDER — KETOROLAC TROMETHAMINE 60 MG/2ML IM SOLN
INTRAMUSCULAR | Status: AC
  Filled 2017-12-03: qty 2

## 2017-12-03 NOTE — Discharge Instructions (Signed)
Toradol injection in office today. Start prednisone as directed. Flexeril as needed at night. Flexeril can make you drowsy, so do not take if you are going to drive, operate heavy machinery, or make important decisions. Ice/heat compresses as needed. Follow up with orthopedics for further evaluation if symptoms not improving.  If experience numbness/tingling of the inner thighs, loss of bladder or bowel control, go to the emergency department for evaluation.

## 2017-12-03 NOTE — ED Triage Notes (Signed)
Pt presents with complaints of lower back pain since Monday.

## 2017-12-03 NOTE — ED Provider Notes (Signed)
[facility]    CSN: [id number] Arrival date & time: 12/03/17  1255     History   Chief Complaint Chief Complaint  Patient presents with  . appointment 1 pm, back pain    HPI Catherine [name] is a 42 y.o. female.   42 year old female comes in for 2 day history of low back pain. States she has mild back pain to the low back at baseline due to known degenerative disc disease.  She was playing with her son 2 days ago, tried to catch herself when he was falling, and started feeling increased pain to the low back.  States pain is constant, cramping in sensation, worse with walking. States putting a pillow on the back when sitting can mildly alleviate the pain.  States pain can radiate to left hip that is burning in sensation.  Now with some tightness to the back as well.  Denies saddle anesthesia, loss of bladder or bowel control.  Denies injury/trauma.  Ibuprofen 800 mg 3 times a day with no relief.     Past Medical History:  Diagnosis Date  . Allergic rhinitis   . Anxiety   . Deviated septum   . FHx: allergies   . GERD (gastroesophageal reflux disease)   . H/O scarlet fever   . Hiatal hernia   . History of kidney stones   . Hyperlipidemia   . Hypertension   . Hypothyroidism   . Kidney stones   . MDD (major depressive disorder)   . Meralgia paresthetica   . PTSD (post-traumatic stress disorder)     Patient Active Problem List   Diagnosis Date Noted  . Vitamin D deficiency 02/18/2017  . Hypothyroidism 01/28/2017  . Morbid obesity ([place]) 01/28/2017  . MDD (major depressive disorder), recurrent severe, without psychosis ([place]) 08/10/2016  . Major depressive disorder, recurrent episode, moderate ([place]) 08/02/2016  . Migraine with status migrainosus 02/24/2014  . Cholelithiasis with acute cholecystitis 11/26/2013  . GERD (gastroesophageal reflux disease) 11/10/2013  . Vaginitis and vulvovaginitis, unspecified 11/10/2013  . S/P hysterectomy 10/06/2013  .  Dyspareunia 09/21/2013  . Excessive or frequent menstruation 09/21/2013    Past Surgical History:  Procedure Laterality Date  . ABDOMINAL HYSTERECTOMY N/A 10/06/2013   Procedure: HYSTERECTOMY ABDOMINAL;  Surgeon: Florian Buff, MD;  Location: [facility];  Service: Gynecology;  Laterality: N/A;  . BILATERAL SALPINGECTOMY Bilateral 10/06/2013   Procedure: BILATERAL SALPINGECTOMY;  Surgeon: Florian Buff, MD;  Location: [facility];  Service: Gynecology;  Laterality: Bilateral;  . BLADDER SURGERY     reports had bladder stretched as child  . CHOLECYSTECTOMY N/A 11/26/2013   Procedure: LAPAROSCOPIC CHOLECYSTECTOMY;  Surgeon: Scherry Ran, MD;  Location: [facility];  Service: General;  Laterality: N/A;  . WISDOM TOOTH EXTRACTION      OB History    Gravida  1   Para  0   Term  0   Preterm  0   AB  1   Living  0     SAB  1   TAB  0   Ectopic  0   Multiple  0   Live Births               Home Medications    Prior to Admission medications   Medication Sig Start Date End Date Taking? Authorizing Provider  cyclobenzaprine (FLEXERIL) 5 MG tablet Take 1 tablet (5 mg total) by mouth at bedtime as needed for muscle spasms. 12/03/17   Tasia Catchings,  Amy V, PA-C  predniSONE (STERAPRED UNI-PAK 21 TAB) 10 MG (21) TBPK tablet Take by mouth daily. Take 6 tabs by mouth day 1, then 5 tabs, then 4 tabs, then 3 tabs, 2 tabs, then 1 tab for the last day 12/03/17   Ok Edwards, PA-C    Family History Family History  Problem Relation Age of Onset  . Heart disease Father   . Hypertension Father   . Cancer Father   . Cirrhosis Father   . Thyroid disease Maternal Grandmother   . Cancer Maternal Grandmother   . Diabetes Maternal Grandfather   . Hypertension Maternal Grandfather   . Cancer Maternal Aunt   . Thyroid disease Maternal Aunt   . Depression Maternal Aunt   . Cancer Paternal Aunt   . Migraines Neg Hx     Social History Social History   Tobacco Use  . Smoking status: Current Every Day  Smoker    Packs/day: 0.50    Years: 24.00    Pack years: 12.00    Types: Cigarettes    Last attempt to quit: 10/08/2011    Years since quitting: 6.1  . Smokeless tobacco: Never Used  Substance Use Topics  . Alcohol use: No    Alcohol/week: 0.0 oz    Comment: 08-02-2016 per pt do no  . Drug use: No    Comment: 08-02-2016 per pt no     Allergies   Effexor xr [venlafaxine hcl er]; Morphine and related; and Sulfa antibiotics   Review of Systems Review of Systems  Reason unable to perform ROS: See HPI as above.     Physical Exam Triage Vital Signs ED Triage Vitals [12/03/17 1306]  Enc Vitals Group     BP      Pulse      Resp      Temp      Temp src      SpO2      Weight      Height      Head Circumference      Peak Flow      Pain Score 8     Pain Loc      Pain Edu?      Excl. in [place]?    No data found.  Updated Vital Signs BP (!) 138/92 (BP Location: Right Arm)   Pulse 97   Temp 98.3 F (36.8 C) (Oral)   Resp 20   LMP  (LMP Unknown)   SpO2 98%    Physical Exam  Constitutional: She is oriented to person, place, and time. She appears well-developed and well-nourished. No distress.  HENT:  Head: Normocephalic and atraumatic.  Eyes: Pupils are equal, round, and reactive to light. Conjunctivae are normal.  Cardiovascular: Normal rate, regular rhythm and normal heart sounds. Exam reveals no gallop and no friction rub.  No murmur heard. Pulmonary/Chest: Effort normal and breath sounds normal. No accessory muscle usage or stridor. No respiratory distress. She has no decreased breath sounds. She has no wheezes. She has no rhonchi. She has no rales.  Musculoskeletal:  Diffuse tenderness to palpation of lumbar midline and right paraspinal muscles. No tenderness to palpation of left back, bilateral hips.  Unable to do active range of motion of hips due to back pain.  Full passive range of motion of hips, though still with low back pain.  Strength deferred.  Sensation  intact and equal bilaterally.  Negative straight leg raise.    Neurological: She is alert and oriented  to person, place, and time.  Skin: Skin is warm and dry.     UC Treatments / Results  Labs (all labs ordered are listed, but only abnormal results are displayed) Labs Reviewed - No data to display  EKG None  Radiology No results found.  Procedures Procedures (including critical care time)  Medications Ordered in UC Medications  ketorolac (TORADOL) injection 60 mg (60 mg Intramuscular Given 12/03/17 1338)    Initial Impression / Assessment and Plan / UC Course  I have reviewed the triage vital signs and the nursing notes.  Pertinent labs & imaging results that were available during my care of the patient were reviewed by me and considered in my medical decision making (see chart for details).    Toradol injection in office today. Start prednisone as directed. Muscle relaxant as needed. Ice/heat compresses. Discussed with patient strain can take up to 3-4 weeks to resolve, but should be getting better each week. Return precautions given.   Final Clinical Impressions(s) / UC Diagnoses   Final diagnoses:  Acute right-sided low back pain without sciatica    ED Prescriptions    Medication Sig Dispense Auth. Provider   predniSONE (STERAPRED UNI-PAK 21 TAB) 10 MG (21) TBPK tablet Take by mouth daily. Take 6 tabs by mouth day 1, then 5 tabs, then 4 tabs, then 3 tabs, 2 tabs, then 1 tab for the last day 21 tablet Yu, Amy V, PA-C   cyclobenzaprine (FLEXERIL) 5 MG tablet Take 1 tablet (5 mg total) by mouth at bedtime as needed for muscle spasms. 10 tablet Tobin Chad, [place] 12/03/17 1523

## 2018-05-01 ENCOUNTER — Emergency Department (HOSPITAL_COMMUNITY): Payer: Commercial Managed Care - PPO

## 2018-05-01 ENCOUNTER — Encounter (HOSPITAL_COMMUNITY): Payer: Self-pay | Admitting: Emergency Medicine

## 2018-05-01 ENCOUNTER — Other Ambulatory Visit: Payer: Self-pay

## 2018-05-01 ENCOUNTER — Emergency Department (HOSPITAL_COMMUNITY)
Admission: EM | Admit: 2018-05-01 | Discharge: 2018-05-01 | Disposition: A | Discharge status: Home / Self Care | Payer: Commercial Managed Care - PPO | Attending: Emergency Medicine | Admitting: Emergency Medicine

## 2018-05-01 DIAGNOSIS — E039 Hypothyroidism, unspecified: Secondary | ICD-10-CM | POA: Diagnosis not present

## 2018-05-01 DIAGNOSIS — Z79899 Other long term (current) drug therapy: Secondary | ICD-10-CM | POA: Diagnosis not present

## 2018-05-01 DIAGNOSIS — I10 Essential (primary) hypertension: Secondary | ICD-10-CM | POA: Insufficient documentation

## 2018-05-01 DIAGNOSIS — R200 Anesthesia of skin: Secondary | ICD-10-CM | POA: Diagnosis present

## 2018-05-01 DIAGNOSIS — F17210 Nicotine dependence, cigarettes, uncomplicated: Secondary | ICD-10-CM | POA: Insufficient documentation

## 2018-05-01 DIAGNOSIS — R202 Paresthesia of skin: Secondary | ICD-10-CM | POA: Diagnosis not present

## 2018-05-01 LAB — BASIC METABOLIC PANEL
Anion gap: 7 (ref 5–15)
BUN: 17 mg/dL (ref 6–20)
CALCIUM: 8.9 mg/dL (ref 8.9–10.3)
CO2: 24 mmol/L (ref 22–32)
CREATININE: 0.94 mg/dL (ref 0.44–1.00)
Chloride: 106 mmol/L (ref 98–111)
Glucose, Bld: 103 mg/dL — ABNORMAL HIGH (ref 70–99)
Potassium: 3.6 mmol/L (ref 3.5–5.1)
SODIUM: 137 mmol/L (ref 135–145)

## 2018-05-01 LAB — CBC WITH DIFFERENTIAL/PLATELET
Abs Immature Granulocytes: 0.11 10*3/uL — ABNORMAL HIGH (ref 0.00–0.07)
BASOS ABS: 0.1 10*3/uL (ref 0.0–0.1)
BASOS PCT: 1 %
EOS ABS: 0.2 10*3/uL (ref 0.0–0.5)
EOS PCT: 2 %
HCT: 42.4 % (ref 36.0–46.0)
HEMOGLOBIN: 13.5 g/dL (ref 12.0–15.0)
Immature Granulocytes: 1 %
LYMPHS ABS: 3.4 10*3/uL (ref 0.7–4.0)
Lymphocytes Relative: 29 %
MCH: 29.6 pg (ref 26.0–34.0)
MCHC: 31.8 g/dL (ref 30.0–36.0)
MCV: 93 fL (ref 80.0–100.0)
Monocytes Absolute: 0.7 10*3/uL (ref 0.1–1.0)
Monocytes Relative: 6 %
NEUTROS PCT: 61 %
NRBC: 0 % (ref 0.0–0.2)
Neutro Abs: 7.3 10*3/uL (ref 1.7–7.7)
PLATELETS: 323 10*3/uL (ref 150–400)
RBC: 4.56 MIL/uL (ref 3.87–5.11)
RDW: 13.5 % (ref 11.5–15.5)
WBC: 11.9 10*3/uL — ABNORMAL HIGH (ref 4.0–10.5)

## 2018-05-01 LAB — HEPATIC FUNCTION PANEL
ALBUMIN: 3.9 g/dL (ref 3.5–5.0)
ALT: 16 U/L (ref 0–44)
AST: 16 U/L (ref 15–41)
Alkaline Phosphatase: 62 U/L (ref 38–126)
Bilirubin, Direct: <0.1 mg/dL (ref 0.0–0.2)
Total Bilirubin: 0.3 mg/dL (ref 0.3–1.2)
Total Protein: 7.8 g/dL (ref 6.5–8.1)

## 2018-05-01 MED ORDER — GABAPENTIN 100 MG PO CAPS: 100.0000 mg | ORAL_CAPSULE | Freq: Three times a day (TID) | ORAL | 0 refills | Status: DC

## 2018-05-01 NOTE — Discharge Instructions (Addendum)
Follow up with dr. Merlene Laughter in 2-3 weeks

## 2018-05-01 NOTE — ED Triage Notes (Signed)
Patient complaining of numbness to bilateral legs, bilateral fingers and toes x 1 week. States she started with numbness to bilateral cheeks at approximately 1600 today.

## 2018-05-01 NOTE — ED Provider Notes (Signed)
[facility] EMERGENCY DEPARTMENT Provider Note   CSN: [id number] Arrival date & time: 05/01/18  [place]     History   Chief Complaint Chief Complaint  Patient presents with  . Numbness    HPI Catherine [name] is a 42 y.o. female.  Patient complains of numbness to her legs her arms or buttocks patient is been going on for weeks.  The history is provided by the patient. No language interpreter was used.  Illness  This is a new problem. The current episode started more than 1 week ago. The problem occurs constantly. The problem has not changed since onset.Pertinent negatives include no chest pain, no abdominal pain and no headaches. Nothing aggravates the symptoms. Nothing relieves the symptoms. She has tried nothing for the symptoms.    Past Medical History:  Diagnosis Date  . Allergic rhinitis   . Anxiety   . Deviated septum   . FHx: allergies   . GERD (gastroesophageal reflux disease)   . H/O scarlet fever   . Hiatal hernia   . History of kidney stones   . Hyperlipidemia   . Hypertension   . Hypothyroidism   . Kidney stones   . MDD (major depressive disorder)   . Meralgia paresthetica   . PTSD (post-traumatic stress disorder)     Patient Active Problem List   Diagnosis Date Noted  . Vitamin D deficiency 02/18/2017  . Hypothyroidism 01/28/2017  . Morbid obesity ([place]) 01/28/2017  . MDD (major depressive disorder), recurrent severe, without psychosis ([place]) 08/10/2016  . Major depressive disorder, recurrent episode, moderate ([place]) 08/02/2016  . Migraine with status migrainosus 02/24/2014  . Cholelithiasis with acute cholecystitis 11/26/2013  . GERD (gastroesophageal reflux disease) 11/10/2013  . Vaginitis and vulvovaginitis, unspecified 11/10/2013  . S/P hysterectomy 10/06/2013  . Dyspareunia 09/21/2013  . Excessive or frequent menstruation 09/21/2013    Past Surgical History:  Procedure Laterality Date  . ABDOMINAL HYSTERECTOMY N/A 10/06/2013   Procedure:  HYSTERECTOMY ABDOMINAL;  Surgeon: Florian Buff, MD;  Location: [facility];  Service: Gynecology;  Laterality: N/A;  . BILATERAL SALPINGECTOMY Bilateral 10/06/2013   Procedure: BILATERAL SALPINGECTOMY;  Surgeon: Florian Buff, MD;  Location: [facility];  Service: Gynecology;  Laterality: Bilateral;  . BLADDER SURGERY     reports had bladder stretched as child  . CHOLECYSTECTOMY N/A 11/26/2013   Procedure: LAPAROSCOPIC CHOLECYSTECTOMY;  Surgeon: Scherry Ran, MD;  Location: [facility];  Service: General;  Laterality: N/A;  . WISDOM TOOTH EXTRACTION       OB History    Gravida  1   Para  0   Term  0   Preterm  0   AB  1   Living  0     SAB  1   TAB  0   Ectopic  0   Multiple  0   Live Births               Home Medications    Prior to Admission medications   Medication Sig Start Date End Date Taking? Authorizing Provider  ARMOUR THYROID 60 MG tablet Take 60 mg by mouth daily. 04/06/18  Yes [provider]  ergocalciferol (VITAMIN D2) 1.25 MG (50000 UT) capsule Take 50,000 Units by mouth once a week.   Yes [provider]  gabapentin (NEURONTIN) 100 MG capsule Take 1 capsule (100 mg total) by mouth 3 (three) times daily. 05/01/18   Milton Ferguson, MD    Family History Family History  Problem Relation  Age of Onset  . Heart disease Father   . Hypertension Father   . Cancer Father   . Cirrhosis Father   . Thyroid disease Maternal Grandmother   . Cancer Maternal Grandmother   . Diabetes Maternal Grandfather   . Hypertension Maternal Grandfather   . Cancer Maternal Aunt   . Thyroid disease Maternal Aunt   . Depression Maternal Aunt   . Cancer Paternal Aunt   . Migraines Neg Hx     Social History Social History   Tobacco Use  . Smoking status: Current Every Day Smoker    Packs/day: 0.50    Years: 24.00    Pack years: 12.00    Types: Cigarettes  . Smokeless tobacco: Never Used  Substance Use Topics  . Alcohol use: No    Alcohol/week: 0.0  standard drinks    Comment: 08-02-2016 per pt do no  . Drug use: No    Comment: 08-02-2016 per pt no     Allergies   Effexor xr [venlafaxine hcl er]; Morphine and related; and Sulfa antibiotics   Review of Systems Review of Systems  Constitutional: Negative for appetite change and fatigue.  HENT: Negative for congestion, ear discharge and sinus pressure.   Eyes: Negative for discharge.  Respiratory: Negative for cough.   Cardiovascular: Negative for chest pain.  Gastrointestinal: Negative for abdominal pain and diarrhea.  Genitourinary: Negative for frequency and hematuria.  Musculoskeletal: Negative for back pain.  Skin: Negative for rash.  Neurological: Negative for seizures and headaches.       Numbness  Psychiatric/Behavioral: Negative for hallucinations.     Physical Exam Updated Vital Signs BP (!) 127/96 (BP Location: Left Arm)   Pulse 87   Temp 97.7 F (36.5 C) (Oral)   Resp 20   Ht 5\' 9"  (1.753 m)   Wt 113.4 kg   LMP  (LMP Unknown)   SpO2 99%   BMI 36.92 kg/m   Physical Exam  Constitutional: She is oriented to person, place, and time. She appears well-developed.  HENT:  Head: Normocephalic.  Eyes: Conjunctivae and EOM are normal. No scleral icterus.  Neck: Neck supple. No thyromegaly present.  Cardiovascular: Normal rate and regular rhythm. Exam reveals no gallop and no friction rub.  No murmur heard. Pulmonary/Chest: No stridor. She has no wheezes. She has no rales. She exhibits no tenderness.  Abdominal: She exhibits no distension. There is no tenderness. There is no rebound.  Musculoskeletal: Normal range of motion. She exhibits no edema.  Lymphadenopathy:    She has no cervical adenopathy.  Neurological: She is oriented to person, place, and time. She exhibits normal muscle tone. Coordination normal.  Patient has decreased sensation to both buttocks fingers both lower extremities but full range of motion with good strength  Skin: No rash noted. No  erythema.  Psychiatric: She has a normal mood and affect. Her behavior is normal.     ED Treatments / Results  Labs (all labs ordered are listed, but only abnormal results are displayed) Labs Reviewed  CBC WITH DIFFERENTIAL/PLATELET - Abnormal; Notable for the following components:      Result Value   WBC 11.9 (*)    Abs Immature Granulocytes 0.11 (*)    All other components within normal limits  BASIC METABOLIC PANEL - Abnormal; Notable for the following components:   Glucose, Bld 103 (*)    All other components within normal limits  HEPATIC FUNCTION PANEL    EKG None  Radiology Ct Head Wo Contrast  Result Date: 05/01/2018 CLINICAL DATA:  Subacute numbness in the bilateral legs, fingers, and toes. EXAM: CT HEAD WITHOUT CONTRAST TECHNIQUE: Contiguous axial images were obtained from the base of the skull through the vertex without intravenous contrast. COMPARISON:  12/26/2008 FINDINGS: Brain: No evidence of acute infarction, hemorrhage, hydrocephalus, extra-axial collection or mass lesion/mass effect. Mild low-density around the frontal horn of the right lateral ventricle from nonspecific remote insult, stable from 2010. Vascular: No hyperdense vessel or unexpected calcification. Skull: Normal. Negative for fracture or focal lesion. Sinuses/Orbits: No acute finding. IMPRESSION: No acute finding. Electronically Signed   By: Monte Fantasia M.D.   On: 05/01/2018 19:26    Procedures Procedures (including critical care time)  Medications Ordered in ED Medications - No data to display   Initial Impression / Assessment and Plan / ED Course  I have reviewed the triage vital signs and the nursing notes.  Pertinent labs & imaging results that were available during my care of the patient were reviewed by me and considered in my medical decision making (see chart for details).     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  She will be placed on Neurontin and referred to  neurology  Final Clinical Impressions(s) / ED Diagnoses   Final diagnoses:  Paresthesia    ED Discharge Orders         Ordered    gabapentin (NEURONTIN) 100 MG capsule  3 times daily     05/01/18 2214           Milton Ferguson, MD 05/01/18 2217

## 2018-05-01 NOTE — ED Notes (Signed)
Patient refused discharge vital signs, stated the cuff made her feel like her fingers nails were going to pop off.

## 2019-02-10 ENCOUNTER — Emergency Department (HOSPITAL_COMMUNITY): Payer: No Typology Code available for payment source

## 2019-02-10 ENCOUNTER — Emergency Department (HOSPITAL_COMMUNITY)
Admission: EM | Admit: 2019-02-10 | Discharge: 2019-02-10 | Disposition: A | Discharge status: Home / Self Care | Payer: No Typology Code available for payment source | Attending: Emergency Medicine | Admitting: Emergency Medicine

## 2019-02-10 ENCOUNTER — Other Ambulatory Visit: Payer: Self-pay

## 2019-02-10 ENCOUNTER — Encounter (HOSPITAL_COMMUNITY): Payer: Self-pay | Admitting: Emergency Medicine

## 2019-02-10 DIAGNOSIS — E039 Hypothyroidism, unspecified: Secondary | ICD-10-CM | POA: Diagnosis not present

## 2019-02-10 DIAGNOSIS — R151 Fecal smearing: Secondary | ICD-10-CM | POA: Diagnosis not present

## 2019-02-10 DIAGNOSIS — F17210 Nicotine dependence, cigarettes, uncomplicated: Secondary | ICD-10-CM | POA: Insufficient documentation

## 2019-02-10 DIAGNOSIS — I10 Essential (primary) hypertension: Secondary | ICD-10-CM | POA: Insufficient documentation

## 2019-02-10 DIAGNOSIS — Z79899 Other long term (current) drug therapy: Secondary | ICD-10-CM | POA: Diagnosis not present

## 2019-02-10 DIAGNOSIS — R202 Paresthesia of skin: Secondary | ICD-10-CM | POA: Diagnosis not present

## 2019-02-10 DIAGNOSIS — M5441 Lumbago with sciatica, right side: Secondary | ICD-10-CM | POA: Diagnosis not present

## 2019-02-10 DIAGNOSIS — M545 Low back pain: Secondary | ICD-10-CM | POA: Diagnosis present

## 2019-02-10 LAB — URINALYSIS, ROUTINE W REFLEX MICROSCOPIC
Bilirubin Urine: NEGATIVE
Glucose, UA: NEGATIVE mg/dL
Hgb urine dipstick: NEGATIVE
Ketones, ur: NEGATIVE mg/dL
Leukocytes,Ua: NEGATIVE
Nitrite: NEGATIVE
Protein, ur: NEGATIVE mg/dL
Specific Gravity, Urine: 1.004 — ABNORMAL LOW (ref 1.005–1.030)
pH: 6 (ref 5.0–8.0)

## 2019-02-10 LAB — I-STAT BETA HCG BLOOD, ED (MC, WL, AP ONLY): I-stat hCG, quantitative: <5 m[IU]/mL (ref <5)

## 2019-02-10 LAB — BASIC METABOLIC PANEL
Anion gap: 13 (ref 5–15)
BUN: 10 mg/dL (ref 6–20)
CO2: 23 mmol/L (ref 22–32)
Calcium: 9.5 mg/dL (ref 8.9–10.3)
Chloride: 101 mmol/L (ref 98–111)
Creatinine, Ser: 1 mg/dL (ref 0.44–1.00)
GFR calc Af Amer: >60 mL/min (ref >60)
GFR calc non Af Amer: >60 mL/min (ref >60)
Glucose, Bld: 100 mg/dL — ABNORMAL HIGH (ref 70–99)
Potassium: 4.1 mmol/L (ref 3.5–5.1)
Sodium: 137 mmol/L (ref 135–145)

## 2019-02-10 LAB — CBC
HCT: 39.6 % (ref 36.0–46.0)
Hemoglobin: 13 g/dL (ref 12.0–15.0)
MCH: 30.2 pg (ref 26.0–34.0)
MCHC: 32.8 g/dL (ref 30.0–36.0)
MCV: 92.1 fL (ref 80.0–100.0)
Platelets: 274 10*3/uL (ref 150–400)
RBC: 4.3 MIL/uL (ref 3.87–5.11)
RDW: 13.2 % (ref 11.5–15.5)
WBC: 10 10*3/uL (ref 4.0–10.5)
nRBC: 0 % (ref 0.0–0.2)

## 2019-02-10 MED ORDER — PREDNISONE 20 MG PO TABS: 40.0000 mg | ORAL_TABLET | Freq: Every day | ORAL | 0 refills | Status: AC

## 2019-02-10 MED ORDER — LIDOCAINE 5 % EX PTCH: 1.0000 | MEDICATED_PATCH | CUTANEOUS | 0 refills | Status: DC

## 2019-02-10 MED ORDER — HYDROMORPHONE HCL 1 MG/ML IJ SOLN
1.0000 mg | Freq: Once | INTRAMUSCULAR | Status: AC
  Administered 2019-02-10: 1 mg via INTRAVENOUS
  Filled 2019-02-10: qty 1

## 2019-02-10 NOTE — ED Provider Notes (Signed)
[facility] EMERGENCY DEPARTMENT Provider Note   CSN: [id number] Arrival date & time: 02/10/19  1228     History   Chief Complaint Chief Complaint  Patient presents with  . Back Pain  . Loss of Bowel    HPI Catherine [name] is a 43 y.o. female with history of meralgia paresthetica, PTSD, major depressive disorder, nephrolithiasis, hypertension, hyperlipidemia, GERD presenting for evaluation of acute onset, progressively worsening back pain for 9 days.  She states that she was at work moving packages when she planted her feet on the ground and rotated a certain way that caused sudden onset right-sided back pains.  She reports the pain has been constant, moderate to severe, sharp and stabbing.  It worsens with position changes but at times she will find a comfortable position laying flat or standing.  She denies any radicular pain.  Since Saturday she has had 3 episodes of stool incontinence. She describes that she will pass flatus and notice stool around her buttocks and in her underwear. No urinary symptoms including retention and incontinences.  Today she had another episode of stool incontinence and also noticed paresthesias around the buttocks and groin. She states "it feels like when your foot falls asleep",  Denies fever, history of IV drug use, or history of cancer.  She has no abdominal pain, nausea, vomiting, chest pain, or shortness of breath.  She has been taking 600 mg ibuprofen and Flexeril at night with little relief.  Sent from her workplace physician today for further evaluation.     The history is provided by the patient.    Past Medical History:  Diagnosis Date  . Allergic rhinitis   . Anxiety   . Deviated septum   . FHx: allergies   . GERD (gastroesophageal reflux disease)   . H/O scarlet fever   . Hiatal hernia   . History of kidney stones   . Hyperlipidemia   . Hypertension   . Hypothyroidism   . Kidney stones   . MDD (major depressive  disorder)   . Meralgia paresthetica   . PTSD (post-traumatic stress disorder)     Patient Active Problem List   Diagnosis Date Noted  . Vitamin D deficiency 02/18/2017  . Hypothyroidism 01/28/2017  . Morbid obesity ([place]) 01/28/2017  . MDD (major depressive disorder), recurrent severe, without psychosis ([place]) 08/10/2016  . Major depressive disorder, recurrent episode, moderate ([place]) 08/02/2016  . Migraine with status migrainosus 02/24/2014  . Cholelithiasis with acute cholecystitis 11/26/2013  . GERD (gastroesophageal reflux disease) 11/10/2013  . Vaginitis and vulvovaginitis, unspecified 11/10/2013  . S/P hysterectomy 10/06/2013  . Dyspareunia 09/21/2013  . Excessive or frequent menstruation 09/21/2013    Past Surgical History:  Procedure Laterality Date  . ABDOMINAL HYSTERECTOMY N/A 10/06/2013   Procedure: HYSTERECTOMY ABDOMINAL;  Surgeon: Florian Buff, MD;  Location: [facility];  Service: Gynecology;  Laterality: N/A;  . BILATERAL SALPINGECTOMY Bilateral 10/06/2013   Procedure: BILATERAL SALPINGECTOMY;  Surgeon: Florian Buff, MD;  Location: [facility];  Service: Gynecology;  Laterality: Bilateral;  . BLADDER SURGERY     reports had bladder stretched as child  . CHOLECYSTECTOMY N/A 11/26/2013   Procedure: LAPAROSCOPIC CHOLECYSTECTOMY;  Surgeon: Scherry Ran, MD;  Location: [facility];  Service: General;  Laterality: N/A;  . WISDOM TOOTH EXTRACTION       OB History    Gravida  1   Para  0   Term  0   Preterm  0   AB  1   Living  0     SAB  1   TAB  0   Ectopic  0   Multiple  0   Live Births               Home Medications    Prior to Admission medications   Medication Sig Start Date End Date Taking? Authorizing Provider  ARMOUR THYROID 60 MG tablet Take 60 mg by mouth daily. 04/06/18   [provider]  ergocalciferol (VITAMIN D2) 1.25 MG (50000 UT) capsule Take 50,000 Units by mouth once a week.    [provider]  gabapentin (NEURONTIN)  100 MG capsule Take 1 capsule (100 mg total) by mouth 3 (three) times daily. 05/01/18   Milton Ferguson, MD    Family History Family History  Problem Relation Age of Onset  . Heart disease Father   . Hypertension Father   . Cancer Father   . Cirrhosis Father   . Thyroid disease Maternal Grandmother   . Cancer Maternal Grandmother   . Diabetes Maternal Grandfather   . Hypertension Maternal Grandfather   . Cancer Maternal Aunt   . Thyroid disease Maternal Aunt   . Depression Maternal Aunt   . Cancer Paternal Aunt   . Migraines Neg Hx     Social History Social History   Tobacco Use  . Smoking status: Current Every Day Smoker    Packs/day: 0.50    Years: 24.00    Pack years: 12.00    Types: Cigarettes  . Smokeless tobacco: Never Used  Substance Use Topics  . Alcohol use: No    Alcohol/week: 0.0 standard drinks    Comment: 08-02-2016 per pt do no  . Drug use: No    Comment: 08-02-2016 per pt no     Allergies   Effexor xr [venlafaxine hcl er], Morphine and related, and Sulfa antibiotics   Review of Systems Review of Systems  Respiratory: Negative for shortness of breath.   Cardiovascular: Negative for chest pain.  Gastrointestinal: Negative for abdominal pain, nausea and vomiting.  Genitourinary: Negative for decreased urine volume, dysuria, frequency and urgency.  Musculoskeletal: Positive for back pain.  Neurological: Negative for weakness and numbness.  All other systems reviewed and are negative.    Physical Exam Updated Vital Signs BP 122/81 (BP Location: Right Arm)   Pulse 90   Temp 98.1 F (36.7 C) (Oral)   Resp 17   Ht 5\' 9"  (1.753 m)   Wt 110.7 kg   LMP  (LMP Unknown)   SpO2 100%   BMI 36.03 kg/m   Physical Exam Vitals signs and nursing note reviewed.  Constitutional:      General: She is not in acute distress.    Appearance: She is well-developed.  HENT:     Head: Normocephalic and atraumatic.  Eyes:     General:        Right eye: No  discharge.        Left eye: No discharge.     Conjunctiva/sclera: Conjunctivae normal.  Neck:     Vascular: No JVD.     Trachea: No tracheal deviation.  Cardiovascular:     Rate and Rhythm: Normal rate and regular rhythm.     Pulses: Normal pulses.  Pulmonary:     Effort: Pulmonary effort is normal.     Breath sounds: Normal breath sounds.  Abdominal:     General: Bowel sounds are normal. There is no distension.     Palpations:  Abdomen is soft.     Tenderness: There is no abdominal tenderness. There is no guarding or rebound.  Genitourinary:    Comments: Examination performed in the presence of a chaperone.  Slightly decreased rectal tone. Musculoskeletal:     Comments: Focal midline lumbar tenderness along L4-5 and L5-S1.  Also has left SI joint tenderness.  No deformity, crepitus, or step-off.  5/5 strength of BLE major muscle groups.  Positive straight leg raise on the right.  Decreased range of motion of the lumbar spine with flexion and extension  Skin:    General: Skin is warm and dry.     Findings: No erythema.  Neurological:     Mental Status: She is alert.     Sensory: Sensory deficit present.     Motor: No weakness.     Comments: Fluent speech, no facial droop, altered sensation to light touch of the left lower extremity in the L5-S1 dermatomal distribution.  Ambulatory with antalgic gait but exhibits good balance.  Has some difficulty with heel walk and toe walk.  Psychiatric:        Behavior: Behavior normal.      ED Treatments / Results  Labs (all labs ordered are listed, but only abnormal results are displayed) Labs Reviewed  BASIC METABOLIC PANEL - Abnormal; Notable for the following components:      Result Value   Glucose, Bld 100 (*)    All other components within normal limits  URINALYSIS, ROUTINE W REFLEX MICROSCOPIC - Abnormal; Notable for the following components:   Color, Urine STRAW (*)    Specific Gravity, Urine 1.004 (*)    All other components  within normal limits  CBC  I-STAT BETA HCG BLOOD, ED ([facility], [facility], [facility] ONLY)    EKG None  Radiology No results found.  Procedures Procedures (including critical care time)  Medications Ordered in ED Medications  HYDROmorphone (DILAUDID) injection 1 mg (1 mg Intravenous Given 02/10/19 1516)     Initial Impression / Assessment and Plan / ED Course  I have reviewed the triage vital signs and the nursing notes.  Pertinent labs & imaging results that were available during my care of the patient were reviewed by me and considered in my medical decision making (see chart for details).        Patient presenting for evaluation of progressively worsening back pain secondary to injury 8 days ago.  3 days ago developed fecal incontinence and today developed saddle paresthesia.  She is afebrile, initially hypertensive with improvement on reevaluation.  She is nontoxic in appearance.  She has focal midline lumbar spine tenderness on examination and slightly decreased rectal tone.  She has numbness in the L5-S1 dermatomal distribution.  With progressive symptoms, fecal incontinence and paresthesias, because concern for possible cauda equina syndrome.  Low suspicion of epidural/spinal abscess, or dissection.  Will obtain MRI for further evaluation.  3:32 PM CONSULT: Spoke with Dr. Ronnald Ramp with neurosurgery.  Patient is still pending MRI.  He recommends addition of measuring post void residual.  If she is retaining urine, he recommends in and out cath to determine exactly how much.  4:44 PM Signed out to oncoming provider PA Brayton Layman.  Pending MRI results.  Lab work reviewed by me shows no leukocytosis, no anemia, no metabolic derangements.  Her UA does not suggest UTI or nephrolithiasis.  If MRI shows any concerning findings, neurosurgery should be reconsulted.  If entirely unremarkable, patient can be discharged home with follow-up with neurosurgery or orthopedics  on an outpatient basis.  I would  recommend discharge with a steroid taper, Flexeril to take at night, lidocaine patches, maybe a few tablets of hydrocodone for severe breakthrough pain, and instructions for other conservative measures to manage her pain.  Final Clinical Impressions(s) / ED Diagnoses   Final diagnoses:  Acute right-sided low back pain with right-sided sciatica  Fecal smearing  Paresthesia    ED Discharge Orders    None       Renita Papa, PA-C 02/10/19 1645    Gareth Morgan, MD 02/11/19 2300

## 2019-02-10 NOTE — ED Notes (Signed)
Patient transported to MRI 

## 2019-02-10 NOTE — ED Notes (Signed)
Bladder scan volume: 110 ml.

## 2019-02-10 NOTE — Discharge Instructions (Addendum)
Take the medications as prescribed.  Follow-up with neurosurgery.  Their contact information is listed in your discharge paperwork.  Do not drive, operate heavy machinery while taking this medicine.  Return for any new worsening symptoms.

## 2019-02-10 NOTE — ED Triage Notes (Addendum)
Pt arrives to ED from work with complaints of lower back pain since last Monday when she was lifting boxes. Patient stated she went to her MD which stated it was just a muscle sprain. But starting on Saturday patient has lost control of her bowels and is having numbness in her buttocks. No loss of feeling or tingling pts legs. Patient states the back pain has not gotten better since the injury. Patient does have a hx of meralgia paresthetica.

## 2019-02-10 NOTE — ED Notes (Signed)
Pt verbalized understanding of discharge instructions and follow up care. Time was allowed for pt to ask questions and gain any clarity needed. IV removed and bleeding controlled. Pt ambulatory to lobby with steady gait.

## 2019-02-10 NOTE — ED Notes (Signed)
Bladder scan volume: 0 ml (after pt used the restroom)

## 2019-02-10 NOTE — ED Provider Notes (Signed)
Transferred from previous provider, Clinton Quant.  See note for full HPI.  In summation 43 year old female with history of meralgia paresthetica, PTSD, depression, pretension, hyperlipidemia, GERD who presents for evaluation of progressively worsening back pain over the last 9 days.  And worse with changing positions.  Denies any radicular pain.  States beginning on Saturday she has had 3 episodes of stool incontinence.  Patient describes that she will pass flatulence and noticed stool around her buttocks and in her underwear.  No urinary symptoms including retention or incontinence.  Has been taking Flexeril provided by PCP.  Has been ambulatory despite pain.  Previous provider has consulted Dr. Ronnald Ramp to let him know we are ruling the patient out for cauda equina.  He request postvoid residual and if retaining to cath IM now.  Pending MRI to rule out cauda equina.  Plan to follow-up on imaging.  If imaging negative patient may be DC'd home with lidocaine patches, small course of pain medicine, prednisone.  Physical Exam  BP (!) 102/57 (BP Location: Right Arm)   Pulse 64   Temp 98.1 F (36.7 C) (Oral)   Resp 18   Ht 5\' 9"  (1.753 m)   Wt 110.7 kg   LMP  (LMP Unknown)   SpO2 96%   BMI 36.03 kg/m   Physical Exam Vitals signs and nursing note reviewed.  Constitutional:      General: She is not in acute distress.    Appearance: She is well-developed. She is not ill-appearing, toxic-appearing or diaphoretic.  HENT:     Head: Atraumatic.  Eyes:     Pupils: Pupils are equal, round, and reactive to light.  Neck:     Musculoskeletal: Normal range of motion.  Cardiovascular:     Rate and Rhythm: Normal rate.     Pulses:          Dorsalis pedis pulses are 2+ on the right side and 2+ on the left side.       Posterior tibial pulses are 2+ on the right side and 2+ on the left side.     Comments: No murmurs, rubs or gallops Pulmonary:     Effort: No respiratory distress.     Comments: Clear to  auscultation. Abdominal:     General: There is no distension.     Comments: Soft, nontender without rebound or guarding.  Musculoskeletal: Normal range of motion.     Comments: Focal tenderness in distribution of L4-S1.  There is mild SI tenderness.  Positive straight leg raise on right.  Skin:    General: Skin is warm and dry.     Comments: No rashes or lesions.  No edema, erythema, ecchymosis or warmth.  No Pallor.  Neurological:     Mental Status: She is alert.     Cranial Nerves: Cranial nerves are intact.     Sensory: Sensation is intact.     Motor: Motor function is intact.     Coordination: Coordination is intact.     Gait: Gait is intact.     Comments: 5/5 strength bilateral lower extremities.  Sensation intact.  Ambulatory without difficulty.    ED Course/Procedures     Procedures Labs Reviewed  BASIC METABOLIC PANEL - Abnormal; Notable for the following components:      Result Value   Glucose, Bld 100 (*)    All other components within normal limits  URINALYSIS, ROUTINE W REFLEX MICROSCOPIC - Abnormal; Notable for the following components:   Color, Urine STRAW (*)  Specific Gravity, Urine 1.004 (*)    All other components within normal limits  CBC  I-STAT BETA HCG BLOOD, ED ([facility], [facility], [facility] ONLY)  Mr Lumbar Spine Wo Contrast  Result Date: 02/10/2019 CLINICAL DATA:  Low back pain.  Cauda equina syndrome suspected. EXAM: MRI LUMBAR SPINE WITHOUT CONTRAST TECHNIQUE: Multiplanar, multisequence MR imaging of the lumbar spine was performed. No intravenous contrast was administered. COMPARISON:  Lumbar MRI 05/21/2017 FINDINGS: Segmentation:  Normal Alignment:  Normal Vertebrae: Normal bone marrow. Negative for fracture or mass. Small hemangioma L2 vertebral body on the left. Conus medullaris and cauda equina: Conus extends to the T12-L1 level. Conus and cauda equina appear normal. Paraspinal and other soft tissues: Negative for retroperitoneal mass or adenopathy. No soft tissue  edema or fluid collection. Disc levels: L1-2: Negative L2-3: Negative L3-4: Mild disc degeneration with disc bulging and shallow central disc protrusion. Mild facet degeneration. No significant stenosis and no change from the prior MRI L4-5: Mild disc degeneration with small central disc protrusion. Negative for stenosis. No change from the prior study. L5-S1: Negative IMPRESSION: Shallow central disc protrusions at L3-4 and L4-5 without stenosis or neural impingement. Otherwise negative Electronically Signed   By: Franchot Gallo M.D.   On: 02/10/2019 16:11    MDM  43 year old female presents for evaluation of back pain.  History neuralgia paresthetica.  Care transferred from previous provider.  See note.  Labs without acute findings. Plan to follow-up on MRI.  Plan If neg-- Steroid taper, Lidoderm patches, allergy to opiates Plan if positive- Called [facility] back  MRI with shallow central disc protrusion without stenosis, neural impingement, no evidence of cauda equina, otherwise negative.  She did have a postvoid residual of 110 however this is been 2 hours since she last urinated after they got the postvoid residual.  Discussed rescanning immediately after urination.  Ambulatory without difficulty.  Low suspicion for cauda equina, discitis, osteomyelitis, transverse myelitis, psoas abscess.  Repeat bladder scan after using the restroom 0.  No evidence of urinary retention.  Patient to follow-up with neurosurgery or to return to ED with new worsening symptoms.  The patient has been appropriately medically screened and/or stabilized in the ED. I have low suspicion for any other emergent medical condition which would require further screening, evaluation or treatment in the ED or require inpatient management.  Patient is hemodynamically stable and in no acute distress.  Patient able to ambulate in department prior to ED.  Evaluation does not show acute pathology that would require ongoing or additional  emergent interventions while in the emergency department or further inpatient treatment.  I have discussed the diagnosis with the patient and answered all questions.  Pain is been managed while in the emergency department and patient has no further complaints prior to discharge.  Patient is comfortable with plan discussed in room and is stable for discharge at this time.  I have discussed strict return precautions for returning to the emergency department.  Patient was encouraged to follow-up with PCP/specialist refer to at discharge.       Addasyn Mcbreen A, PA-C 02/10/19 [place], [place], DO 02/10/19 2049

## 2019-02-10 NOTE — ED Notes (Signed)
ED Provider at bedside. 

## 2019-11-09 ENCOUNTER — Encounter (HOSPITAL_BASED_OUTPATIENT_CLINIC_OR_DEPARTMENT_OTHER): Payer: Self-pay | Admitting: Emergency Medicine

## 2019-11-09 ENCOUNTER — Other Ambulatory Visit: Payer: Self-pay

## 2019-11-09 ENCOUNTER — Emergency Department (HOSPITAL_BASED_OUTPATIENT_CLINIC_OR_DEPARTMENT_OTHER): Payer: Commercial Managed Care - PPO

## 2019-11-09 ENCOUNTER — Emergency Department (HOSPITAL_BASED_OUTPATIENT_CLINIC_OR_DEPARTMENT_OTHER)
Admission: EM | Admit: 2019-11-09 | Discharge: 2019-11-09 | Disposition: A | Discharge status: Home / Self Care | Payer: Commercial Managed Care - PPO | Attending: Emergency Medicine | Admitting: Emergency Medicine

## 2019-11-09 DIAGNOSIS — Z79899 Other long term (current) drug therapy: Secondary | ICD-10-CM | POA: Insufficient documentation

## 2019-11-09 DIAGNOSIS — R0789 Other chest pain: Secondary | ICD-10-CM | POA: Insufficient documentation

## 2019-11-09 DIAGNOSIS — E039 Hypothyroidism, unspecified: Secondary | ICD-10-CM | POA: Diagnosis not present

## 2019-11-09 DIAGNOSIS — Z885 Allergy status to narcotic agent status: Secondary | ICD-10-CM | POA: Diagnosis not present

## 2019-11-09 DIAGNOSIS — E785 Hyperlipidemia, unspecified: Secondary | ICD-10-CM | POA: Insufficient documentation

## 2019-11-09 DIAGNOSIS — Z882 Allergy status to sulfonamides status: Secondary | ICD-10-CM | POA: Diagnosis not present

## 2019-11-09 DIAGNOSIS — I10 Essential (primary) hypertension: Secondary | ICD-10-CM | POA: Diagnosis not present

## 2019-11-09 DIAGNOSIS — F17210 Nicotine dependence, cigarettes, uncomplicated: Secondary | ICD-10-CM | POA: Insufficient documentation

## 2019-11-09 DIAGNOSIS — Z888 Allergy status to other drugs, medicaments and biological substances status: Secondary | ICD-10-CM | POA: Diagnosis not present

## 2019-11-09 DIAGNOSIS — R079 Chest pain, unspecified: Secondary | ICD-10-CM

## 2019-11-09 LAB — BASIC METABOLIC PANEL
Anion gap: 7 (ref 5–15)
BUN: 14 mg/dL (ref 6–20)
CO2: 25 mmol/L (ref 22–32)
Calcium: 8.8 mg/dL — ABNORMAL LOW (ref 8.9–10.3)
Chloride: 106 mmol/L (ref 98–111)
Creatinine, Ser: 0.8 mg/dL (ref 0.44–1.00)
GFR calc Af Amer: >60 mL/min (ref >60)
GFR calc non Af Amer: >60 mL/min (ref >60)
Glucose, Bld: 101 mg/dL — ABNORMAL HIGH (ref 70–99)
Potassium: 3.7 mmol/L (ref 3.5–5.1)
Sodium: 138 mmol/L (ref 135–145)

## 2019-11-09 LAB — TROPONIN I (HIGH SENSITIVITY)
Troponin I (High Sensitivity): 2 ng/L (ref <18)
Troponin I (High Sensitivity): <2 ng/L (ref <18)

## 2019-11-09 LAB — CBC
HCT: 37.6 % (ref 36.0–46.0)
Hemoglobin: 12.8 g/dL (ref 12.0–15.0)
MCH: 30.5 pg (ref 26.0–34.0)
MCHC: 34 g/dL (ref 30.0–36.0)
MCV: 89.5 fL (ref 80.0–100.0)
Platelets: 250 10*3/uL (ref 150–400)
RBC: 4.2 MIL/uL (ref 3.87–5.11)
RDW: 13 % (ref 11.5–15.5)
WBC: 8.6 10*3/uL (ref 4.0–10.5)
nRBC: 0 % (ref 0.0–0.2)

## 2019-11-09 MED ORDER — SODIUM CHLORIDE 0.9 % IV BOLUS
1000.0000 mL | Freq: Once | INTRAVENOUS | Status: AC
  Administered 2019-11-09: 1000 mL via INTRAVENOUS

## 2019-11-09 MED ORDER — FAMOTIDINE IN NACL 20-0.9 MG/50ML-% IV SOLN
20.0000 mg | Freq: Once | INTRAVENOUS | Status: AC
  Administered 2019-11-09: 20 mg via INTRAVENOUS
  Filled 2019-11-09: qty 50

## 2019-11-09 MED ORDER — ALUM & MAG HYDROXIDE-SIMETH 200-200-20 MG/5ML PO SUSP
30.0000 mL | Freq: Once | ORAL | Status: AC
  Administered 2019-11-09: 30 mL via ORAL
  Filled 2019-11-09: qty 30

## 2019-11-09 MED ORDER — LIDOCAINE VISCOUS HCL 2 % MT SOLN
15.0000 mL | Freq: Once | OROMUCOSAL | Status: AC
  Administered 2019-11-09: 15 mL via ORAL
  Filled 2019-11-09: qty 15

## 2019-11-09 MED ORDER — SODIUM CHLORIDE 0.9% FLUSH
3.0000 mL | Freq: Once | INTRAVENOUS | Status: DC
  Filled 2019-11-09: qty 3

## 2019-11-09 MED ORDER — METHOCARBAMOL 500 MG PO TABS: 500.0000 mg | ORAL_TABLET | Freq: Two times a day (BID) | ORAL | 0 refills | Status: DC

## 2019-11-09 NOTE — Discharge Instructions (Signed)
Please follow-up with a cardiologist as well as your primary care doctor.  As discussed today your work-up was benign.  I do recommend keeping a close eye on your symptoms however you may always return to ED for any new or concerning symptoms.  Please drink plenty of water, please gently exercise, stretch, take warm salt water soak for shower in order to help relax your muscles.

## 2019-11-09 NOTE — ED Triage Notes (Signed)
For the past 4 days pt has been having episodes of "Shooting pain" from the left side of her chest "Straight through to my shoulder blade."  Pt sts it only lasts a few seconds but it "Drops me to my knees." No hx of this ever happening before. Has happened twice today. Last time was 1pm and she has been having constant left shoulder pain since then.

## 2019-11-09 NOTE — ED Provider Notes (Signed)
[place] EMERGENCY DEPARTMENT Provider Note   CSN: [id number] Arrival date & time: 11/09/19  1408     History Chief Complaint  Patient presents with  . Chest Pain    Catherine [name] is a 44 y.o. female.  HPI  44 year old female with past medical history significant for hiatal hernia, history of kidney stones, lipidemia, hypothyroidism, thyroid disorder on armour thyroid, HTN.   Patient presented today for 3 episodes today sharp stabbing chest pain seems to originate in her shoulder and feels like a "electrocution "seems to go all the way through her chest.  She denies any nausea or vomiting or diaphoresis.  She denies any lightheadedness or syncope near syncope or dizziness.  She denies any fevers chills cough cold congestion.  She states that the pain is severe when it occurs in 10/10.  She states that the first episode caught her very off guard when she was sitting still and nearly dropped her to her knees.  She states that there seems to be no aggravating or mitigating factors.  She has had 1 episode occurred during her ED visit but states it was very mild.  She denies any symptoms currently.  HPI: A 44 year old patient presents for evaluation of chest pain. Initial onset of pain was more than 6 hours ago. The patient's chest pain is sharp and is not worse with exertion. The patient's chest pain is middle- or left-sided, is not well-localized, is not described as heaviness/pressure/tightness and does not radiate to the arms/jaw/neck. The patient does not complain of nausea and denies diaphoresis. The patient has a family history of coronary artery disease in a first-degree relative with onset less than age 25. The patient has no history of stroke, has no history of peripheral artery disease, has not smoked in the past 90 days, denies any history of treated diabetes, is not hypertensive, has no history of hypercholesterolemia and does not have an elevated BMI (>=30).   Past  Medical History:  Diagnosis Date  . Allergic rhinitis   . Anxiety   . Deviated septum   . FHx: allergies   . GERD (gastroesophageal reflux disease)   . H/O scarlet fever   . Hiatal hernia   . History of kidney stones   . Hyperlipidemia   . Hypertension   . Hypothyroidism   . Kidney stones   . MDD (major depressive disorder)   . Meralgia paresthetica   . PTSD (post-traumatic stress disorder)     Patient Active Problem List   Diagnosis Date Noted  . Vitamin D deficiency 02/18/2017  . Hypothyroidism 01/28/2017  . Morbid obesity ([place]) 01/28/2017  . MDD (major depressive disorder), recurrent severe, without psychosis ([place]) 08/10/2016  . Major depressive disorder, recurrent episode, moderate ([place]) 08/02/2016  . Migraine with status migrainosus 02/24/2014  . Cholelithiasis with acute cholecystitis 11/26/2013  . GERD (gastroesophageal reflux disease) 11/10/2013  . Vaginitis and vulvovaginitis, unspecified 11/10/2013  . S/P hysterectomy 10/06/2013  . Dyspareunia 09/21/2013  . Excessive or frequent menstruation 09/21/2013    Past Surgical History:  Procedure Laterality Date  . ABDOMINAL HYSTERECTOMY N/A 10/06/2013   Procedure: HYSTERECTOMY ABDOMINAL;  Surgeon: Florian Buff, MD;  Location: [facility];  Service: Gynecology;  Laterality: N/A;  . BILATERAL SALPINGECTOMY Bilateral 10/06/2013   Procedure: BILATERAL SALPINGECTOMY;  Surgeon: Florian Buff, MD;  Location: [facility];  Service: Gynecology;  Laterality: Bilateral;  . BLADDER SURGERY     reports had bladder stretched as child  . CHOLECYSTECTOMY N/A 11/26/2013  Procedure: LAPAROSCOPIC CHOLECYSTECTOMY;  Surgeon: Scherry Ran, MD;  Location: [facility];  Service: General;  Laterality: N/A;  . WISDOM TOOTH EXTRACTION       OB History    Gravida  1   Para  0   Term  0   Preterm  0   AB  1   Living  0     SAB  1   TAB  0   Ectopic  0   Multiple  0   Live Births              Family History  Problem Relation  Age of Onset  . Heart disease Father   . Hypertension Father   . Cancer Father   . Cirrhosis Father   . Thyroid disease Maternal Grandmother   . Cancer Maternal Grandmother   . Diabetes Maternal Grandfather   . Hypertension Maternal Grandfather   . Cancer Maternal Aunt   . Thyroid disease Maternal Aunt   . Depression Maternal Aunt   . Cancer Paternal Aunt   . Migraines Neg Hx     Social History   Tobacco Use  . Smoking status: Current Every Day Smoker    Packs/day: 0.50    Years: 24.00    Pack years: 12.00    Types: Cigarettes  . Smokeless tobacco: Never Used  Substance Use Topics  . Alcohol use: No    Alcohol/week: 0.0 standard drinks    Comment: 08-02-2016 per pt do no  . Drug use: No    Comment: 08-02-2016 per pt no    Home Medications Prior to Admission medications   Medication Sig Start Date End Date Taking? Authorizing Provider  ARMOUR THYROID 60 MG tablet Take 60 mg by mouth daily. 04/06/18   [provider]  cyanocobalamin (,VITAMIN B-12,) 1000 MCG/ML injection Inject 1,000 mcg into the muscle every 28 (twenty-eight) days. 10/05/19   [provider]  ergocalciferol (VITAMIN D2) 1.25 MG (50000 UT) capsule Take 50,000 Units by mouth once a week.    [provider]  meloxicam (MOBIC) 15 MG tablet Take 15 mg by mouth daily. 11/09/19   [provider]  methocarbamol (ROBAXIN) 500 MG tablet Take 1 tablet (500 mg total) by mouth 2 (two) times daily. 11/09/19   Tedd Sias, PA    Allergies    Effexor xr [venlafaxine hcl er], Morphine and related, and Sulfa antibiotics  Review of Systems   Review of Systems  Constitutional: Negative for chills and fever.  HENT: Negative for congestion.   Eyes: Negative for pain.  Respiratory: Negative for cough and shortness of breath.   Cardiovascular: Positive for chest pain. Negative for leg swelling.  Gastrointestinal: Negative for abdominal pain and vomiting.  Genitourinary: Negative for  dysuria.  Musculoskeletal: Negative for myalgias.  Skin: Negative for rash.  Neurological: Negative for dizziness and headaches.    Physical Exam Updated Vital Signs BP 130/85   Pulse 69   Temp 98 F (36.7 C) (Oral)   Resp 19   Ht 5\' 9"  (1.753 m)   Wt 103.1 kg   LMP  (LMP Unknown)   SpO2 98%   BMI 33.57 kg/m   Physical Exam Vitals and nursing note reviewed.  Constitutional:      General: She is not in acute distress. HENT:     Head: Normocephalic and atraumatic.     Nose: Nose normal.  Eyes:     General: No scleral icterus. Cardiovascular:  Rate and Rhythm: Normal rate and regular rhythm.     Pulses: Normal pulses.     Heart sounds: Normal heart sounds.     Comments: Bilateral radial pulses 3+ and symmetric Pulmonary:     Effort: Pulmonary effort is normal. No respiratory distress.     Breath sounds: No wheezing.  Abdominal:     Palpations: Abdomen is soft.     Tenderness: There is no abdominal tenderness.  Musculoskeletal:     Cervical back: Normal range of motion.     Right lower leg: No edema.     Left lower leg: No edema.  Skin:    General: Skin is warm and dry.     Capillary Refill: Capillary refill takes less than 2 seconds.  Neurological:     Mental Status: She is alert. Mental status is at baseline.  Psychiatric:        Mood and Affect: Mood normal.        Behavior: Behavior normal.     ED Results / Procedures / Treatments   Labs (all labs ordered are listed, but only abnormal results are displayed) Labs Reviewed  BASIC METABOLIC PANEL - Abnormal; Notable for the following components:      Result Value   Glucose, Bld 101 (*)    Calcium 8.8 (*)    All other components within normal limits  CBC  TROPONIN I (HIGH SENSITIVITY)  TROPONIN I (HIGH SENSITIVITY)    EKG None  Radiology DG Chest 2 View  Result Date: 11/09/2019 CLINICAL DATA:  Chest pain EXAM: CHEST - 2 VIEW COMPARISON:  12/31/2018 FINDINGS: The heart size and mediastinal  contours are within normal limits. Both lungs are clear. Disc degenerative disease of the thoracic spine. IMPRESSION: No acute abnormality of the lungs. Electronically Signed   By: Eddie Candle M.D.   On: 11/09/2019 14:50    Procedures Procedures (including critical care time)  Medications Ordered in ED Medications  sodium chloride flush (NS) 0.9 % injection 3 mL (3 mLs Intravenous Not Given 11/09/19 1526)  sodium chloride 0.9 % bolus 1,000 mL (0 mLs Intravenous Stopped 11/09/19 1700)  famotidine (PEPCID) IVPB 20 mg premix (0 mg Intravenous Stopped 11/09/19 1623)  alum & mag hydroxide-simeth (MAALOX/MYLANTA) 200-200-20 MG/5ML suspension 30 mL (30 mLs Oral Given 11/09/19 1532)    And  lidocaine (XYLOCAINE) 2 % viscous mouth solution 15 mL (15 mLs Oral Given 11/09/19 1532)    ED Course  I have reviewed the triage vital signs and the nursing notes.  Pertinent labs & imaging results that were available during my care of the patient were reviewed by me and considered in my medical decision making (see chart for details).    MDM Rules/Calculators/A&P HEAR Score: 1                    Patient is a 44 year old female with past medical history detailed above presented today for several episodes of sharp stabbing/electrical shooting pain that goes to her chest but also to her shoulder.  She denies any significant associated symptoms.  See HPI for full details.  Physical exam is notable for completely benign PE.  She is PERC negative with no tachycardia or hypoxia.  Low suspicion for ACS she has a heart score of 1 will trend troponins with plan to discharge if they are normal.  EKG is nonischemic with no acute abnormality.  Chest x-ray to bilirubin itself has no infiltrate or abnormality on cardiopulmonary evaluation with plain film.  No leukocytosis or anemia, no electrolyte abnormality.  The emergent causes of chest pain include: Acute coronary syndrome, tamponade, pericarditis/myocarditis, aortic  dissection, pulmonary embolism, tension pneumothorax, pneumonia, and esophageal rupture.  I do not believe the patient has an emergent cause of chest pain, other urgent/non-acute considerations include, but are not limited to: chronic angina, aortic stenosis, cardiomyopathy, mitral valve prolapse, pulmonary hypertension, aortic insufficiency, right ventricular hypertrophy, pleuritis, bronchitis, pneumothorax, tumor, gastroesophageal reflux disease (GERD), esophageal spasm, Mallory-Weiss syndrome, peptic ulcer disease, pancreatitis, functional gastrointestinal pain, cervical or thoracic disk disease or arthritis, shoulder arthritis, costochondritis, subacromial bursitis, anxiety or panic attack, herpes zoster, breast disorders, chest wall tumors, thoracic outlet syndrome, mediastinitis.  Given symptoms most likely this is related to a pinched nerve of some kind.  I discussed this case with my attending physician who cosigned this note including patient's presenting symptoms, physical exam, and planned diagnostics and interventions. Attending physician stated agreement with plan or made changes to plan which were implemented.   Patient feels comfortable discharge at this time.  Will recommend close follow-up with cardiology for full evaluation of chest pain as well as following up with her primary care doctor for further medical use.  She is agreeable to plan.  Is well-appearing at this time is vital signs are normal tolerating p.o. and ambulatory.  She is given strict return precautions and was provided with a printed prescription for Robaxin which she may use as needed for pain.  Final Clinical Impression(s) / ED Diagnoses Final diagnoses:  Chest pain, unspecified type    Rx / DC Orders ED Discharge Orders         Ordered    methocarbamol (ROBAXIN) 500 MG tablet  2 times daily     11/09/19 1712           Pati Gallo [place], [place] 11/09/19 1720    Virgel Manifold, MD 11/09/19 1745

## 2020-07-10 ENCOUNTER — Encounter (HOSPITAL_COMMUNITY): Payer: Self-pay

## 2020-07-10 ENCOUNTER — Emergency Department (HOSPITAL_COMMUNITY)
Admission: EM | Admit: 2020-07-10 | Discharge: 2020-07-11 | Disposition: A | Discharge status: Home / Self Care | Payer: Commercial Managed Care - PPO | Attending: Emergency Medicine | Admitting: Emergency Medicine

## 2020-07-10 ENCOUNTER — Emergency Department (HOSPITAL_COMMUNITY): Payer: Commercial Managed Care - PPO

## 2020-07-10 ENCOUNTER — Other Ambulatory Visit: Payer: Self-pay

## 2020-07-10 DIAGNOSIS — F17210 Nicotine dependence, cigarettes, uncomplicated: Secondary | ICD-10-CM | POA: Insufficient documentation

## 2020-07-10 DIAGNOSIS — I10 Essential (primary) hypertension: Secondary | ICD-10-CM | POA: Insufficient documentation

## 2020-07-10 DIAGNOSIS — Z79899 Other long term (current) drug therapy: Secondary | ICD-10-CM | POA: Diagnosis not present

## 2020-07-10 DIAGNOSIS — R519 Headache, unspecified: Secondary | ICD-10-CM | POA: Diagnosis present

## 2020-07-10 DIAGNOSIS — E039 Hypothyroidism, unspecified: Secondary | ICD-10-CM | POA: Diagnosis not present

## 2020-07-10 DIAGNOSIS — Z7982 Long term (current) use of aspirin: Secondary | ICD-10-CM | POA: Insufficient documentation

## 2020-07-10 MED ORDER — METOCLOPRAMIDE HCL 5 MG/ML IJ SOLN
10.0000 mg | Freq: Once | INTRAMUSCULAR | Status: AC
  Administered 2020-07-10: 10 mg via INTRAVENOUS
  Filled 2020-07-10: qty 2

## 2020-07-10 MED ORDER — DIPHENHYDRAMINE HCL 50 MG/ML IJ SOLN
25.0000 mg | Freq: Once | INTRAMUSCULAR | Status: AC
  Administered 2020-07-10: 25 mg via INTRAVENOUS
  Filled 2020-07-10: qty 1

## 2020-07-10 MED ORDER — SODIUM CHLORIDE 0.9 % IV BOLUS
1000.0000 mL | Freq: Once | INTRAVENOUS | Status: AC
  Administered 2020-07-10: 1000 mL via INTRAVENOUS

## 2020-07-10 NOTE — ED Provider Notes (Signed)
[facility] EMERGENCY DEPARTMENT Provider Note   CSN: [id number] Arrival date & time: 07/10/20  2023   Time seen 11:19 PM  History Chief Complaint  Patient presents with   Headache    Catherine [name] is a 45 y.o. female.  HPI   Patient states she has had headaches for as long as she can remember.  She states a week before Christmas she started getting what she considers a normal headache that she gets all the time however this headache has persisted.  She states normally Advil or Tylenol will make the pain go away but this time it is not.  She states currently the headache has settled behind her left eye and in the left side of her head since January 29.  She denies throbbing component and states it feels like there is not enough room in her brain so it is a pressure type discomfort.  She denies nausea, vomiting, photophobia, phonophobia, fever, cough, sore throat, rhinorrhea.  She denies any new numbness or tingling of her extremities or change in her vision.  She states they have been well furnace and a wood stove however her fianc and son are not complain of headaches and she states they have a carbon monoxide detector in her house.  She states she has had to be treated in the ED for headaches in the past.  She went to a chiropractor today and had dry needling done of the back of her neck for the headache which made the headache go away for about 10 minutes and then it came back even stronger.  PCP Patient, No Pcp Per Endocrinologist Dr. Corwin Levins  Past Medical History:  Diagnosis Date   Allergic rhinitis    Anxiety    Deviated septum    FHx: allergies    GERD (gastroesophageal reflux disease)    H/O scarlet fever    Hiatal hernia    History of kidney stones    Hyperlipidemia    Hypertension    Hypothyroidism    Kidney stones    MDD (major depressive disorder)    Meralgia paresthetica    PTSD (post-traumatic stress disorder)     Patient Active Problem  List   Diagnosis Date Noted   Vitamin D deficiency 02/18/2017   Hypothyroidism 01/28/2017   Morbid obesity ([facility]) 01/28/2017   MDD (major depressive disorder), recurrent severe, without psychosis ([facility]) 08/10/2016   Major depressive disorder, recurrent episode, moderate ([facility]) 08/02/2016   Migraine with status migrainosus 02/24/2014   Cholelithiasis with acute cholecystitis 11/26/2013   GERD (gastroesophageal reflux disease) 11/10/2013   Vaginitis and vulvovaginitis, unspecified 11/10/2013   S/P hysterectomy 10/06/2013   Dyspareunia 09/21/2013   Excessive or frequent menstruation 09/21/2013    Past Surgical History:  Procedure Laterality Date   ABDOMINAL HYSTERECTOMY N/A 10/06/2013   Procedure: HYSTERECTOMY ABDOMINAL;  Surgeon: Lazaro Arms, MD;  Location: [facility];  Service: Gynecology;  Laterality: N/A;   BILATERAL SALPINGECTOMY Bilateral 10/06/2013   Procedure: BILATERAL SALPINGECTOMY;  Surgeon: Lazaro Arms, MD;  Location: [facility];  Service: Gynecology;  Laterality: Bilateral;   BLADDER SURGERY     reports had bladder stretched as child   CHOLECYSTECTOMY N/A 11/26/2013   Procedure: LAPAROSCOPIC CHOLECYSTECTOMY;  Surgeon: Marlane Hatcher, MD;  Location: [facility];  Service: General;  Laterality: N/A;   WISDOM TOOTH EXTRACTION       OB History    Gravida  1   Para  0   Term  0  Preterm  0   AB  1   Living  0     SAB  1   IAB  0   Ectopic  0   Multiple  0   Live Births              Family History  Problem Relation Age of Onset   Heart disease Father    Hypertension Father    Cancer Father    Cirrhosis Father    Thyroid disease Maternal Grandmother    Cancer Maternal Grandmother    Diabetes Maternal Grandfather    Hypertension Maternal Grandfather    Cancer Maternal Aunt    Thyroid disease Maternal Aunt    Depression Maternal Aunt    Cancer Paternal Aunt    Migraines Neg Hx     Social History   Tobacco Use    Smoking status: Current Every Day Smoker    Packs/day: 0.50    Years: 24.00    Pack years: 12.00    Types: Cigarettes   Smokeless tobacco: Never Used  Vaping Use   Vaping Use: Never used  Substance Use Topics   Alcohol use: No    Alcohol/week: 0.0 standard drinks    Comment: 08-02-2016 per pt do no   Drug use: No    Comment: 08-02-2016 per pt no    Home Medications Prior to Admission medications   Medication Sig Start Date End Date Taking? Authorizing Provider  ARMOUR THYROID 60 MG tablet Take 60 mg by mouth daily. Take 180 mg on Monday and 120 mg the rest of the week. 04/06/18  Yes [provider]  Aspirin-Salicylamide-Caffeine (BC HEADACHE POWDER PO) Take 1 packet by mouth daily.   Yes [provider]  cyanocobalamin (,VITAMIN B-12,) 1000 MCG/ML injection Inject 1,000 mcg into the muscle every 28 (twenty-eight) days. 10/05/19  Yes [provider]  ibuprofen (ADVIL) 200 MG tablet Take 400 mg by mouth every 6 (six) hours as needed.   Yes [provider]  ergocalciferol (VITAMIN D2) 1.25 MG (50000 UT) capsule Take 50,000 Units by mouth 2 (two) times a week.    [provider]  meloxicam (MOBIC) 15 MG tablet Take 15 mg by mouth daily. Patient not taking: No sig reported 11/09/19   [provider]  methocarbamol (ROBAXIN) 500 MG tablet Take 1 tablet (500 mg total) by mouth 2 (two) times daily. Patient not taking: No sig reported 11/09/19   Tedd Sias, PA    Allergies    Effexor xr [venlafaxine hcl er], Morphine and related, and Sulfa antibiotics  Review of Systems   Review of Systems  All other systems reviewed and are negative.   Physical Exam Updated Vital Signs BP 121/85 (BP Location: Left Arm)    Pulse 71    Temp 98.1 F (36.7 C) (Oral)    Resp 17    Ht 5\' 9"  (1.753 m)    Wt 95.3 kg    LMP  (LMP Unknown)    SpO2 96%    BMI 31.01 kg/m   Physical Exam Vitals and nursing note reviewed.  Constitutional:       Appearance: Normal appearance. She is obese. She is not ill-appearing or toxic-appearing.     Comments: Looks like she feels bad  HENT:     Head: Normocephalic and atraumatic.     Comments: Patient states she feels like she can feel the vein and her left temporal area.  When I feel it there is some  fullness in that area and tenderness. Eyes:     Extraocular Movements: Extraocular movements intact.     Conjunctiva/sclera: Conjunctivae normal.     Pupils: Pupils are equal, round, and reactive to light.  Cardiovascular:     Rate and Rhythm: Normal rate and regular rhythm.     Pulses: Normal pulses.     Heart sounds: Normal heart sounds.  Pulmonary:     Effort: Pulmonary effort is normal. No respiratory distress.     Breath sounds: Normal breath sounds.  Musculoskeletal:        General: Normal range of motion.     Cervical back: Normal range of motion and neck supple. No tenderness.  Skin:    General: Skin is warm and dry.  Neurological:     General: No focal deficit present.     Mental Status: She is alert and oriented to person, place, and time.     Cranial Nerves: No cranial nerve deficit.  Psychiatric:        Mood and Affect: Mood normal.        Behavior: Behavior normal.        Thought Content: Thought content normal.     ED Results / Procedures / Treatments   Labs (all labs ordered are listed, but only abnormal results are displayed) Results for orders placed or performed during the hospital encounter of 07/10/20  Sedimentation rate  Result Value Ref Range   Sed Rate 14 0 - 22 mm/hr   Laboratory interpretation all normal     EKG None  Radiology CT Head Wo Contrast  Result Date: 07/11/2020 CLINICAL DATA:  Frontal headaches on the left following fall, initial encounter EXAM: CT HEAD WITHOUT CONTRAST TECHNIQUE: Contiguous axial images were obtained from the base of the skull through the vertex without intravenous contrast. COMPARISON:  05/01/2018 FINDINGS: Brain: No  evidence of acute infarction, hemorrhage, hydrocephalus, extra-axial collection or mass lesion/mass effect. Vascular: No hyperdense vessel or unexpected calcification. Skull: Normal. Negative for fracture or focal lesion. Sinuses/Orbits: No acute finding. Other: None. IMPRESSION: No acute intracranial abnormality noted. Electronically Signed   By: Inez Catalina M.D.   On: 07/11/2020 00:14    Procedures Procedures  Medications Ordered in ED Medications  sodium chloride 0.9 % bolus 1,000 mL (0 mLs Intravenous Stopped 07/11/20 0041)  metoCLOPramide (REGLAN) injection 10 mg (10 mg Intravenous Given 07/10/20 2348)  diphenhydrAMINE (BENADRYL) injection 25 mg (25 mg Intravenous Given 07/10/20 2348)    ED Course  I have reviewed the triage vital signs and the nursing notes.  Pertinent labs & imaging results that were available during my care of the patient were reviewed by me and considered in my medical decision making (see chart for details).    MDM Rules/Calculators/A&P                          Patient has a history of headaches but states his headache is different in its character and that it is lasting much longer.  Therefore head CT was done.  Also due to the location sed rate was done to see if there is a possibility for temporal arteritis although she is younger than usual age of over 40.  Patient states she already has a carbon monoxide monitor at home and nobody else is having symptoms by her.  She is agreeable to trying the migraine cocktail.  Recheck at 1:20 AM patient states her headache is better.  She does not  want anything else for the headache.  We discussed her CT scan which was normal.  We are still waiting on her sed rate to return.  1:38 AM patient sed rate has resulted and is normal.  She was discharged home.  She was given referral to Dr. Merlene Laughter, neurologist.  Final Clinical Impression(s) / ED Diagnoses Final diagnoses:  Acute nonintractable headache, unspecified headache  type    Rx / DC Orders ED Discharge Orders    None     Plan discharge  Rolland Porter, MD, Barbette Or, MD 07/11/20 [phone]

## 2020-07-10 NOTE — ED Triage Notes (Signed)
Pt pov from home with cc of headache left frontal area. States it has been  Hurting for 3 weeks ever since she fell. She tripped and fell over her dog. No loc but does not remember if she hit her head. State it has been getting worse. Said she had dry needling done today at 4pm at her physical therapist in [place] to see if that will help. Very tearful in triage.  Hypertensive

## 2020-07-11 LAB — SEDIMENTATION RATE: Sed Rate: 14 mm/hr (ref 0–22)

## 2020-07-11 NOTE — Discharge Instructions (Addendum)
Your CT scan was normal and your blood test was normal tonight.  Go home and rest.  Consider seeing a neurologist if you continue to have bad headaches.

## 2023-03-12 ENCOUNTER — Encounter (HOSPITAL_COMMUNITY): Payer: Self-pay

## 2023-03-12 ENCOUNTER — Other Ambulatory Visit: Payer: Self-pay

## 2023-03-12 ENCOUNTER — Emergency Department (HOSPITAL_COMMUNITY): Payer: BC Managed Care – PPO

## 2023-03-12 ENCOUNTER — Emergency Department (HOSPITAL_COMMUNITY)
Admission: EM | Admit: 2023-03-12 | Discharge: 2023-03-12 | Disposition: A | Discharge status: Home / Self Care | Payer: BC Managed Care – PPO | Attending: Emergency Medicine | Admitting: Emergency Medicine

## 2023-03-12 DIAGNOSIS — Y92007 Garden or yard of unspecified non-institutional (private) residence as the place of occurrence of the external cause: Secondary | ICD-10-CM | POA: Diagnosis not present

## 2023-03-12 DIAGNOSIS — M25571 Pain in right ankle and joints of right foot: Secondary | ICD-10-CM | POA: Diagnosis present

## 2023-03-12 DIAGNOSIS — S82891A Other fracture of right lower leg, initial encounter for closed fracture: Secondary | ICD-10-CM | POA: Diagnosis not present

## 2023-03-12 DIAGNOSIS — X501XXA Overexertion from prolonged static or awkward postures, initial encounter: Secondary | ICD-10-CM | POA: Insufficient documentation

## 2023-03-12 DIAGNOSIS — Z7982 Long term (current) use of aspirin: Secondary | ICD-10-CM | POA: Insufficient documentation

## 2023-03-12 MED ORDER — KETOROLAC TROMETHAMINE 15 MG/ML IJ SOLN
15.0000 mg | Freq: Once | INTRAMUSCULAR | Status: AC
  Administered 2023-03-12: 15 mg via INTRAMUSCULAR
  Filled 2023-03-12: qty 1

## 2023-03-12 NOTE — ED Notes (Signed)
Boot applied to right foot, pt tolerated well and teach back of care verbalized

## 2023-03-12 NOTE — ED Provider Notes (Signed)
[place] EMERGENCY DEPARTMENT AT [facility] Provider Note   CSN: [id number] Arrival date & time: 03/12/23  1948     History  Chief Complaint  Patient presents with   Ankle Pain    Catherine [name] is a 47 y.o. female.  HPI Patient presents with right ankle pain.  She is accompanied by her aunt who assists with the history.  She was mowing the lawn, stepped in a hole, rolled the right ankle.  No subsequent trauma, fall.  Pain is in the right ankle radiating superiorly. No distal loss of sensation.     Home Medications Prior to Admission medications   Medication Sig Start Date End Date Taking? Authorizing Provider  ARMOUR THYROID 60 MG tablet Take 60 mg by mouth daily. Take 180 mg on Monday and 120 mg the rest of the week. 04/06/18   [provider]  Aspirin-Salicylamide-Caffeine (BC HEADACHE POWDER PO) Take 1 packet by mouth daily.    [provider]  cyanocobalamin (,VITAMIN B-12,) 1000 MCG/ML injection Inject 1,000 mcg into the muscle every 28 (twenty-eight) days. 10/05/19   [provider]  ergocalciferol (VITAMIN D2) 1.25 MG (50000 UT) capsule Take 50,000 Units by mouth 2 (two) times a week.    [provider]  ibuprofen (ADVIL) 200 MG tablet Take 400 mg by mouth every 6 (six) hours as needed.    [provider]  meloxicam (MOBIC) 15 MG tablet Take 15 mg by mouth daily. Patient not taking: No sig reported 11/09/19   [provider]  methocarbamol (ROBAXIN) 500 MG tablet Take 1 tablet (500 mg total) by mouth 2 (two) times daily. Patient not taking: No sig reported 11/09/19   Gailen Shelter, PA      Allergies    Effexor xr [venlafaxine hcl er], Morphine and codeine, and Sulfa antibiotics    Review of Systems   Review of Systems  All other systems reviewed and are negative.   Physical Exam Updated Vital Signs BP (!) 173/107 (BP Location: Right Arm)   Pulse (!) 113   Temp 98.8 F (37.1 C) (Oral)   Resp 20    Ht 5\' 9"  (1.753 m)   Wt 115.2 kg   LMP  (LMP Unknown)   SpO2 93%   BMI 37.51 kg/m  Physical Exam Vitals and nursing note reviewed.  Constitutional:      General: She is not in acute distress.    Appearance: She is well-developed.  HENT:     Head: Normocephalic and atraumatic.  Eyes:     Conjunctiva/sclera: Conjunctivae normal.  Cardiovascular:     Rate and Rhythm: Normal rate and regular rhythm.     Pulses: Normal pulses.  Pulmonary:     Effort: Pulmonary effort is normal. No respiratory distress.     Breath sounds: No stridor.  Abdominal:     General: There is no distension.  Musculoskeletal:       Legs:  Skin:    General: Skin is warm and dry.  Neurological:     Mental Status: She is alert and oriented to person, place, and time.     Cranial Nerves: No cranial nerve deficit.  Psychiatric:        Mood and Affect: Mood normal.     ED Results / Procedures / Treatments   Labs (all labs ordered are listed, but only abnormal results are displayed) Labs Reviewed - No data to display  EKG None  Radiology DG Knee Complete 4 Views  Right  Result Date: 03/12/2023 CLINICAL DATA:  Fall, swelling EXAM: RIGHT KNEE - COMPLETE 4+ VIEW COMPARISON:  None Available. FINDINGS: No evidence of fracture, dislocation, or joint effusion. No evidence of arthropathy or other focal bone abnormality. Soft tissues are unremarkable. IMPRESSION: Negative. Electronically Signed   By: Charlett Nose M.D.   On: 03/12/2023 21:21   DG Ankle Complete Right  Result Date: 03/12/2023 CLINICAL DATA:  Fall, swelling, rolled right ankle EXAM: RIGHT ANKLE - COMPLETE 3+ VIEW COMPARISON:  None available FINDINGS: Lateral soft tissue swelling. Small bone fragments adjacent to the lateral malleolus compatible with small avulsed fragments. No tibial abnormality. Joint space maintained. IMPRESSION: Lateral soft tissue swelling with small avulsed fragments off the lateral malleolus. Electronically Signed   By: Charlett Nose M.D.   On: 03/12/2023 21:19    Procedures Procedures    Medications Ordered in ED Medications  ketorolac (TORADOL) 15 MG/ML injection 15 mg (has no administration in time range)    ED Course/ Medical Decision Making/ A&P                                 Medical Decision Making Female presents with right foot/ankle pain following minor accident while mowing the lawn.  She is distally neurovascular intact, proximally has no complaints, systemically is benign in appearance.  I reviewed the x-rays, showed to the patient and her aunt at bedside, concerning for avulsion fracture of the ankle.  Patient had immobilization will follow-up with orthopedics.  Amount and/or Complexity of Data Reviewed Radiology: independent interpretation performed. Decision-making details documented in ED Course.        Final Clinical Impression(s) / ED Diagnoses Final diagnoses:  Closed avulsion fracture of right ankle, initial encounter    Rx / DC Orders ED Discharge Orders     None         Gerhard Munch, MD 03/12/23 2150

## 2023-03-12 NOTE — Discharge Instructions (Signed)
As discussed, you have been diagnosed with an avulsion fracture of the lateral malleolus of your ankle.  Typically this heals with immobilization.  Pain control with ice, ibuprofen, and Tylenol are appropriate.  Return here for concerning changes in your condition.

## 2023-03-12 NOTE — ED Triage Notes (Signed)
Was mowing the yard and stepping in a hole "rolling right ankle".   Swelling present. Pedal pulse present.

## 2023-10-11 ENCOUNTER — Encounter (HOSPITAL_COMMUNITY): Payer: Self-pay

## 2023-10-11 ENCOUNTER — Other Ambulatory Visit: Payer: Self-pay

## 2023-10-11 ENCOUNTER — Emergency Department (HOSPITAL_COMMUNITY)
Admission: EM | Admit: 2023-10-11 | Discharge: 2023-10-12 | Disposition: A | Discharge status: Home / Self Care | Attending: Emergency Medicine | Admitting: Emergency Medicine

## 2023-10-11 DIAGNOSIS — Z7982 Long term (current) use of aspirin: Secondary | ICD-10-CM | POA: Diagnosis not present

## 2023-10-11 DIAGNOSIS — R519 Headache, unspecified: Secondary | ICD-10-CM | POA: Diagnosis not present

## 2023-10-11 DIAGNOSIS — S80862A Insect bite (nonvenomous), left lower leg, initial encounter: Secondary | ICD-10-CM | POA: Diagnosis present

## 2023-10-11 DIAGNOSIS — S80861A Insect bite (nonvenomous), right lower leg, initial encounter: Secondary | ICD-10-CM | POA: Insufficient documentation

## 2023-10-11 DIAGNOSIS — W57XXXA Bitten or stung by nonvenomous insect and other nonvenomous arthropods, initial encounter: Secondary | ICD-10-CM | POA: Insufficient documentation

## 2023-10-11 DIAGNOSIS — M2550 Pain in unspecified joint: Secondary | ICD-10-CM | POA: Insufficient documentation

## 2023-10-11 LAB — CBC WITH DIFFERENTIAL/PLATELET
Abs Immature Granulocytes: 0.07 10*3/uL (ref 0.00–0.07)
Basophils Absolute: 0.1 10*3/uL (ref 0.0–0.1)
Basophils Relative: 1 %
Eosinophils Absolute: 0.3 10*3/uL (ref 0.0–0.5)
Eosinophils Relative: 4 %
HCT: 40.8 % (ref 36.0–46.0)
Hemoglobin: 13.4 g/dL (ref 12.0–15.0)
Immature Granulocytes: 1 %
Lymphocytes Relative: 40 %
Lymphs Abs: 3.1 10*3/uL (ref 0.7–4.0)
MCH: 29.9 pg (ref 26.0–34.0)
MCHC: 32.8 g/dL (ref 30.0–36.0)
MCV: 91.1 fL (ref 80.0–100.0)
Monocytes Absolute: 0.6 10*3/uL (ref 0.1–1.0)
Monocytes Relative: 8 %
Neutro Abs: 3.5 10*3/uL (ref 1.7–7.7)
Neutrophils Relative %: 46 %
Platelets: 239 10*3/uL (ref 150–400)
RBC: 4.48 MIL/uL (ref 3.87–5.11)
RDW: 13.3 % (ref 11.5–15.5)
WBC: 7.6 10*3/uL (ref 4.0–10.5)
nRBC: 0 % (ref 0.0–0.2)

## 2023-10-11 MED ORDER — DOXYCYCLINE HYCLATE 100 MG PO TABS
100.0000 mg | ORAL_TABLET | Freq: Once | ORAL | Status: AC
Start: 2023-10-11 — End: 2023-10-11
  Administered 2023-10-11: 100 mg via ORAL
  Filled 2023-10-11: qty 1

## 2023-10-11 NOTE — ED Provider Notes (Signed)
 [place] EMERGENCY DEPARTMENT AT [facility] Provider Note   CSN: [id number] Arrival date & time: 10/11/23  2230     History {Add pertinent medical, surgical, social history, OB history to HPI:1} Chief Complaint  Patient presents with   Insect Bite    Catherine [name] is a 48 y.o. female.  48 year old female who presents ER today secondary to sleepiness, headache, joint aches.  She states that she has been bit by multiple ticks over the last couple weeks.  Is not sure where they came from but she does go to her aunt who has a dog in the house.  Her and her significant other state that they probably pulled off approximately 10's ticks.  They actually brought a couple when which do not appear to be engorged.  She does shower daily so thinks that she has probably noticed them pretty quickly but she is not sure.  No fevers.  No numbness weakness paresthesias.  No chest pain or shortness of breath.  No lower extremity swelling.  She has not noticed any petechial type rashes or erythema multiforme type rashes.        Home Medications Prior to Admission medications   Medication Sig Start Date End Date Taking? Authorizing Provider  ARMOUR THYROID  60 MG tablet Take 60 mg by mouth daily. Take 180 mg on Monday and 120 mg the rest of the week. 04/06/18   [provider]  Aspirin-Salicylamide-Caffeine (BC HEADACHE POWDER PO) Take 1 packet by mouth daily.    [provider]  cyanocobalamin (,VITAMIN B-12,) 1000 MCG/ML injection Inject 1,000 mcg into the muscle every 28 (twenty-eight) days. 10/05/19   [provider]  ergocalciferol  (VITAMIN D2) 1.25 MG (50000 UT) capsule Take 50,000 Units by mouth 2 (two) times a week.    [provider]  ibuprofen  (ADVIL ) 200 MG tablet Take 400 mg by mouth every 6 (six) hours as needed.    [provider]  meloxicam (MOBIC) 15 MG tablet Take 15 mg by mouth daily. Patient not taking: No sig reported 11/09/19    [provider]  methocarbamol  (ROBAXIN ) 500 MG tablet Take 1 tablet (500 mg total) by mouth 2 (two) times daily. Patient not taking: No sig reported 11/09/19   Coretta Dexter, PA      Allergies    Effexor  xr [venlafaxine  hcl er], Morphine and codeine, and Sulfa antibiotics    Review of Systems   Review of Systems  Physical Exam Updated Vital Signs BP (!) 154/95 (BP Location: Right Arm)   Pulse 83   Temp 97.7 F (36.5 C) (Oral)   Resp 18   Ht 5\' 9"  (1.753 m)   Wt 113.4 kg   LMP  (LMP Unknown)   SpO2 100%   BMI 36.92 kg/m  Physical Exam Vitals and nursing note reviewed.  Constitutional:      Appearance: She is well-developed.  HENT:     Head: Normocephalic and atraumatic.  Eyes:     Pupils: Pupils are equal, round, and reactive to light.  Cardiovascular:     Rate and Rhythm: Normal rate and regular rhythm.  Pulmonary:     Effort: No respiratory distress.     Breath sounds: No stridor.  Abdominal:     General: There is no distension.  Musculoskeletal:     Cervical back: Normal range of motion.  Skin:    Comments: Full wounds on her legs that are scabbed over with small amount of erythema around them.  Neurological:     Mental Status: She is alert.     Comments: No altered mental status, able to give full seemingly accurate history.  Face is symmetric, EOM's intact, pupils equal and reactive, vision intact, tongue and uvula midline without deviation. Upper and Lower extremity motor 5/5, intact pain perception in distal extremities, 2+ reflexes in biceps, patella and achilles tendons. Able to perform finger to nose normal with both hands. Walks without assistance or evident ataxia.       ED Results / Procedures / Treatments   Labs (all labs ordered are listed, but only abnormal results are displayed) Labs Reviewed  CBC WITH DIFFERENTIAL/PLATELET  COMPREHENSIVE METABOLIC PANEL WITH GFR  TSH  LYME DISEASE SEROLOGY W/REFLEX  T4, FREE     EKG None  Radiology No results found.  Procedures Procedures  {Document cardiac monitor, telemetry assessment procedure when appropriate:1}  Medications Ordered in ED Medications  doxycycline (VIBRA-TABS) tablet 100 mg (has no administration in time range)    ED Course/ Medical Decision Making/ A&P   {   Click here for ABCD2, HEART and other calculatorsREFRESH Note before signing :1}                              Medical Decision Making Amount and/or Complexity of Data Reviewed Labs: ordered.  Risk Prescription drug management.   No specific rash to suggest a tick borne illness but with systemic symptoms that could be lyme (and is in this area) or other tick borne illness, will check titers, start doxy. Has h/o thyroiditis and recently started back on meds so will check thyroid  studies as well. Doubt meningitis. Also consider rheum/autoimmune diagnosis however PCP can work that up if not doing better in the timeframe expected.  ***  {Document critical care time when appropriate:1} {Document review of labs and clinical decision tools ie heart score, Chads2Vasc2 etc:1}  {Document your independent review of radiology images, and any outside records:1} {Document your discussion with family members, caretakers, and with consultants:1} {Document social determinants of health affecting pt's care:1} {Document your decision making why or why not admission, treatments were needed:1} Final Clinical Impression(s) / ED Diagnoses Final diagnoses:  None    Rx / DC Orders ED Discharge Orders     None

## 2023-10-11 NOTE — ED Triage Notes (Addendum)
 Pt states she has pulled multiple ticks off of her within the last 2 weeks. Starting last night, developed headache, body aches, increasing tiredness. Denies fevers at home

## 2023-10-12 LAB — COMPREHENSIVE METABOLIC PANEL WITH GFR
ALT: 19 U/L (ref 0–44)
AST: 15 U/L (ref 15–41)
Albumin: 3.4 g/dL — ABNORMAL LOW (ref 3.5–5.0)
Alkaline Phosphatase: 103 U/L (ref 38–126)
Anion gap: 7 (ref 5–15)
BUN: 18 mg/dL (ref 6–20)
CO2: 26 mmol/L (ref 22–32)
Calcium: 9 mg/dL (ref 8.9–10.3)
Chloride: 105 mmol/L (ref 98–111)
Creatinine, Ser: 0.82 mg/dL (ref 0.44–1.00)
GFR, Estimated: >60 mL/min (ref >60)
Glucose, Bld: 106 mg/dL — ABNORMAL HIGH (ref 70–99)
Potassium: 3.9 mmol/L (ref 3.5–5.1)
Sodium: 138 mmol/L (ref 135–145)
Total Bilirubin: 0.6 mg/dL (ref 0.0–1.2)
Total Protein: 7.3 g/dL (ref 6.5–8.1)

## 2023-10-12 LAB — T4, FREE: Free T4: 1 ng/dL (ref 0.61–1.12)

## 2023-10-12 LAB — TSH: TSH: 0.01 u[IU]/mL — ABNORMAL LOW (ref 0.350–4.500)

## 2023-10-12 MED ORDER — DOXYCYCLINE HYCLATE 100 MG PO CAPS: 100.0000 mg | ORAL_CAPSULE | Freq: Two times a day (BID) | ORAL | 0 refills | Status: AC

## 2023-10-12 MED ORDER — MELOXICAM 15 MG PO TABS
15.0000 mg | ORAL_TABLET | Freq: Every day | ORAL | 0 refills | Status: AC
Start: 2023-10-12 — End: 2023-10-27

## 2023-10-12 NOTE — ED Notes (Signed)
 Pt/family received d/c paperwork at this time. After going over the paperwork any questions, comments, or concerns were answered to the best of this nurse's knowledge. The pt/family verbally acknowledged the teachings/instructions.

## 2023-10-14 LAB — LYME DISEASE SEROLOGY W/REFLEX: Lyme Total Antibody EIA: NEGATIVE
